# Patient Record
Sex: Male | Born: 1960
Health system: Southern US, Community
[De-identification: ages and names within clinical notes are randomized; demographics above are authoritative.]

## PROBLEM LIST (undated history)

## (undated) DIAGNOSIS — N529 Male erectile dysfunction, unspecified: Secondary | ICD-10-CM

## (undated) DIAGNOSIS — J302 Other seasonal allergic rhinitis: Secondary | ICD-10-CM

## (undated) DIAGNOSIS — C801 Malignant (primary) neoplasm, unspecified: Secondary | ICD-10-CM

## (undated) DIAGNOSIS — C4491 Basal cell carcinoma of skin, unspecified: Secondary | ICD-10-CM

## (undated) DIAGNOSIS — I480 Paroxysmal atrial fibrillation: Secondary | ICD-10-CM

## (undated) HISTORY — DX: Basal cell carcinoma of skin, unspecified: C44.91

## (undated) HISTORY — DX: Malignant (primary) neoplasm, unspecified: C80.1

## (undated) HISTORY — DX: Paroxysmal atrial fibrillation: I48.0

## (undated) HISTORY — PX: PROSTATECTOMY: SHX69

## (undated) HISTORY — DX: Male erectile dysfunction, unspecified: N52.9

## (undated) HISTORY — DX: Other seasonal allergic rhinitis: J30.2

---

## 1980-11-09 HISTORY — PX: LAMINECTOMY: SHX219

## 2004-10-22 ENCOUNTER — Inpatient Hospital Stay (HOSPITAL_COMMUNITY): Admission: RE | Admit: 2004-10-22 | Discharge: 2004-10-24 | Payer: Self-pay | Admitting: Orthopedic Surgery

## 2006-10-09 HISTORY — PX: JOINT REPLACEMENT: SHX530

## 2007-03-10 DIAGNOSIS — C801 Malignant (primary) neoplasm, unspecified: Secondary | ICD-10-CM

## 2007-03-10 HISTORY — DX: Malignant (primary) neoplasm, unspecified: C80.1

## 2007-04-07 ENCOUNTER — Encounter (INDEPENDENT_AMBULATORY_CARE_PROVIDER_SITE_OTHER): Payer: Self-pay | Admitting: Urology

## 2007-04-07 ENCOUNTER — Observation Stay (HOSPITAL_COMMUNITY): Admission: RE | Admit: 2007-04-07 | Discharge: 2007-04-08 | Payer: Self-pay | Admitting: Urology

## 2008-05-18 ENCOUNTER — Encounter: Admission: RE | Admit: 2008-05-18 | Discharge: 2008-05-18 | Payer: Self-pay | Admitting: Orthopedic Surgery

## 2011-03-24 NOTE — Discharge Summary (Signed)
NAME:  Ethan Powers, Ethan Powers NO.:  [id number]   MEDICAL RECORD NO.:  [id number]          PATIENT TYPE:  INP   LOCATION:  1432                         FACILITY:  [facility]   PHYSICIAN:  Heloise Purpura, MD      DATE OF BIRTH:  10-Dec-1960   DATE OF ADMISSION:  04/07/2007  DATE OF DISCHARGE:  04/08/2007                               DISCHARGE SUMMARY   ADMISSION DIAGNOSIS:  Prostate cancer.   DISCHARGE DIAGNOSIS:  Prostate cancer.   HISTORY AND PHYSICAL:  For full details, please see admission history  and physical.  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  After discussing management  options, he elected to proceed with surgical therapy.   HOSPITAL COURSE:  On Apr 07, 2007, the patient was taken to the  operating room and underwent a robotic- assisted laparoscopic radical  prostatectomy and bilateral pelvic lymphadenectomy.  He tolerated this  procedure well and without complications.  Postoperatively, he was able  to be transferred to a regular hospital room following recovery from  anesthesia.  He was able to begin ambulating which he did without  difficulty.  On the morning of postoperative day one, his hemoglobin was  checked and found to be stable at 13.1.  He had minimal drainage from  his pelvic drain and excellent urine output and his pelvic drain was  removed.  He was able to be on a clear liquid diet which he tolerated  without difficulty and was subsequently able to be transitioned to oral  pain medication.  By the afternoon of postoperative day one, he had met  all discharge criteria was able to be discharged home in excellent  condition.   DISPOSITION:  Home.   DISCHARGE MEDICATIONS:  The patient was instructed to take Vicodin as  needed for pain, Colace as a stool softener, and to begin taking Cipro  one day prior to his return visit for Foley catheter removal.   DISCHARGE INSTRUCTIONS:  The patient was  instructed to be ambulatory but  specifically told to refrain from any heavy lifting, strenuous activity,  or driving.  He was told to gradually advance his diet once passing  flatus.  In addition, he was instructed on routine Foley catheter care.   FOLLOWUP:  Dr. Providence Lanius will follow up in one week for removal of the  Foley catheter and to discuss his surgical pathology in detail.           ______________________________  Heloise Purpura, MD  Electronically Signed     LB/MEDQ  D:  04/08/2007  T:  04/08/2007  Job:  [id number]

## 2011-03-24 NOTE — H&P (Signed)
NAME:  Ethan Powers, Ethan Powers NO.:  [id number]   MEDICAL RECORD NO.:  [id number]          PATIENT TYPE:  INP   LOCATION:  X003                         FACILITY:  [facility]   PHYSICIAN:  Heloise Purpura, MD      DATE OF BIRTH:  12-22-1960   DATE OF ADMISSION:  04/07/2007  DATE OF DISCHARGE:                              HISTORY & PHYSICAL   CHIEF COMPLAINT:  Prostate cancer.   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  After discussing management options for clinically localized  prostate cancer, the patient elected to proceed with surgical therapy  and a robotic prostatectomy.   PAST MEDICAL HISTORY:  None.   PAST SURGICAL HISTORY:  1. Total knee arthroplasty in 2005.  2. Laminectomy in 1982.   MEDICATIONS:  None.   ALLERGIES:  NO KNOWN DRUG ALLERGIES.  The patient does have an  intolerance to BENADRYL which causes muscle spasms.   FAMILY HISTORY:  The patient's grandfather had prostate cancer, although  did not die of prostate cancer.  Both his parents lived to be age 40.   SOCIAL HISTORY:  The patient is a Education officer, community.  He is married.  He denies  tobacco use.  He does drink alcohol occasionally.   REVIEW OF SYSTEMS:  A complete review of systems was performed.  All  systems are negative.   PHYSICAL EXAMINATION:  CONSTITUTIONAL:  Well-nourished, well-developed  age-appropriate male in no acute distress.  CARDIOVASCULAR:  Regular rate and rhythm without obvious murmurs.  LUNGS:  Clear bilaterally.  ABDOMEN:  Soft, nontender, nondistended, without abdominal masses.  DIGITAL RECTAL:  No nodularity or induration.   IMPRESSION:  Clinically localized adenocarcinoma of the prostate.   PLAN:  Dr. Providence Lanius will undergo robotic assisted laparoscopic radical  prostatectomy and bilateral pelvic  lymphadenectomy.  He will then be  admitted to the hospital for routine postoperative care.           ______________________________  Heloise Purpura, MD     LB/MEDQ  D:  04/07/2007  T:  04/07/2007  Job:  [id number]

## 2011-03-24 NOTE — Op Note (Signed)
NAME:  Ethan Powers, Ethan Powers NO.:  [id number]   MEDICAL RECORD NO.:  [id number]          PATIENT TYPE:  INP   LOCATION:  X003                         [facility]AC[facility]   PHYSICIAN:  Heloise Purpura, MD      DATE OF BIRTH:  [birth year]   DATE OF PROCEDURE:  04/07/2007  DATE OF DISCHARGE:                               OPERATIVE REPORT   PREOPERATIVE DIAGNOSIS:  Clinically localized adenocarcinoma of the  prostate.   POSTOPERATIVE DIAGNOSIS:  Clinically localized adenocarcinoma of the  prostate.   PROCEDURES:  1. Robotic assisted laparoscopic radical prostatectomy (bilateral      nerve sparing).  2. Bilateral laparoscopic pelvic lymphadenectomy.   SURGEON:  Crecencio Mc, M.D.   ASSISTANT:  Dr. [place] Service.   ANESTHESIA:  General.   COMPLICATIONS:  None.   ESTIMATED BLOOD LOSS:  150 mL.   SPECIMENS:  1. Prostate seminal vesicles.  2. Right pelvic lymph nodes.  3. Left pelvic lymph nodes.   DISPOSITION OF SPECIMENS:  To pathology.   DRAINS:  1. 20-French straight catheter.  2. #19 Blake pelvic drain.   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  His pretreatment IPSS was 3 with an IIEF score of 25.  After discussing  management options for clinically localized prostate cancer, he elected  to proceed with surgical therapy and the above procedure.  Potential  risks and benefits were discussed with the patient and informed consent  was obtained.   DESCRIPTION OF PROCEDURE:  The patient was taken to the operating room  and a general anesthetic was administered.  He was given preoperative  antibiotics, placed in the dorsal lithotomy position, and prepped and  draped in the usual sterile fashion.  Next, a preoperative time-out was  performed.  A Foley catheter was then inserted into the bladder.  A site  was selected just inferior to the umbilicus for  placement of the camera  port.  This was placed using a standard open Hassan technique.  This  allowed entry into the peritoneal cavity under direct vision without  difficulty.  A 12 mm port was then placed and a pneumoperitoneum was  established.  The 0 degree lens was then used to inspect the abdomen.  There was no evidence of any intra-abdominal injuries.  There were noted  to be adhesions between the ascending colon and the abdominal wall in  the right lower quadrant.  An 8 mm robotic port was placed lateral and  just inferior to the camera port site.  An additional 8 mm robotic port  was placed in the far left lateral abdominal wall.  A 5 mm port was  placed just to the right and superior to the camera port.  Using these  ports which were placed under direct vision and without difficulty, the  aforementioned adhesions were taken down with laparoscopic scissors.  This allowed the two right lateral ports to be placed.  An 8 mm robotic  port was placed about  10 cm from the camera port laterally and just  inferior to the camera port.  An additional 12 mm port was placed in the  far right lateral abdominal wall.  All ports again were placed under  direct vision and without difficulty.  The surgical cart was then  docked.  With the aid of the cautery scissors, the bladder was reflected  posteriorly allowing entry into the space of Retzius and identification  of the endopelvic fascia and prostate.  The endopelvic fascia was  incised from the apex back to the base of the prostate bilaterally and  the underlying levator muscle fibers were swept laterally off the  prostate.  This isolated the dorsal venous complex which was then  stapled and divided with a 45 mm Flex ETS stapler.  The bladder neck was  identified with the aid of Foley catheter manipulation and was divided  anteriorly.  This exposed the Foley catheter.  The catheter balloon was  deflated and the catheter was brought into the  operative field and used  to retract the prostate anteriorly.  The posterior bladder neck was then  divided and dissection continued between the bladder and prostate until  the vasa deferentia and seminal vesicles were identified.  The vasa  deferentia were isolated and divided and lifted anteriorly.  The seminal  vesicles were then dissected down to their tips where care was taken to  control the seminal vesicle arterial blood supply.  The seminal vesicles  were then dissected free and lifted anteriorly.  The space between  Denonvilliers' fascia and the anterior rectum was then bluntly developed  thereby isolating the vascular pedicles of the prostate.  The lateral  prostatic fascia was then incised allowing the neurovascular bundles to  be swept laterally and posteriorly off the prostate.  The vascular  pedicles of the prostate were then ligated with Hem-o-lok clips above  the level of the neurovascular bundles and divided with sharp cold  scissor dissection.  The neurovascular bundles were then swept off the  apex of the prostate and urethra.  The urethra was sharply divided and  the prostate specimen was disarticulated.  The pelvis was copiously  irrigated and hemostasis was ensured.  There was noted to be a small  arterial bleeding site at the distal aspect of the left neurovascular  bundle.  This was carefully oversewn with a figure-of-eight Vicryl  suture with care taken not to disrupt the neurovascular bundle.  There  was no evidence of a rectal injury.  Attention then turned to the right  pelvic sidewall.  The fibrofatty tissue between the external iliac vein,  confluence of the iliac vessels, internal iliac artery, and Cooper's  ligament was dissected free from the pelvic sidewall with care to  preserve the obturator nerve.  Hem-o-loc clips were used for hemostasis  and lymphostasis.  This specimen was then passed off for permanent pathologic analysis.  An identical procedure was  then performed on the  contralateral side.  Attention then turned to the urethral anastomosis.  Denonvilliers' fascia was reapproximated to the posterior urethral  tissue and reapproximated with a 2-0 Vicryl slip-knot.  The bladder neck  and urethra was reapproximated with a 2-0 Vicryl slip-knot and a 3-0  Monocryl double-armed suture was used to perform a 360 degree running  tension-free anastomosis between the bladder neck and urethra.  A new 20-  [place] coude catheter was inserted into the bladder and irrigated.  There no blood clots within the bladder and the  anastomosis appeared to  be watertight.  A #19 Blake drain was brought through the left robotic  port and appropriately positioned in the pelvis.  It was secured to skin  with a nylon suture.  The surgical cart was then undocked.  The right  lateral 12 mm port site was closed with a 0 Vicryl suture placed with  the aid of the suture passer device.  The prostate specimen was removed  intact within the Endopouch retrieval bag via the periumbilical port  site.  This fascial opening was closed with a running 0 Vicryl suture.  All remaining ports were removed under  direct vision.  We injected 0.25% Marcaine into all port sites which  were reapproximated to the skin level with staples.  Sterile dressings  were applied.  The patient appeared to tolerate the procedure well and  without complications.  He was able to be extubated and transferred to  the recovery unit in satisfactory condition.           ______________________________  Heloise Purpura, MD  Electronically Signed     LB/MEDQ  D:  04/07/2007  T:  04/07/2007  Job:  [id number]

## 2011-03-27 NOTE — Op Note (Signed)
NAME:  Ethan, Powers NO.:  [id number]   MEDICAL RECORD NO.:  [id number]          PATIENT TYPE:  INP   LOCATION:  0003                         FACILITY:  [facility]   PHYSICIAN:  Ollen Gross, M.D.    DATE OF BIRTH:  1961/07/28   DATE OF PROCEDURE:  10/22/2004  DATE OF DISCHARGE:                                 OPERATIVE REPORT   PREOPERATIVE DIAGNOSIS:  Osteoarthritis, left knee.   POSTOPERATIVE DIAGNOSIS:  Osteoarthritis, left knee.   PROCEDURE:  Left total knee arthroplasty, computer navigated.   SURGEON:  Ollen Gross, M.D.   ASSISTANT:  Avel Peace, M.D.   ANESTHESIA:  Spinal.   ESTIMATED BLOOD LOSS:  Minimal.   DRAINS:  Hemovac x1.   TOURNIQUET TIME:  72 minutes at 300 mmHg.   COMPLICATIONS:  None.   CONDITION:  Stable.   BRIEF CLINICAL NOTE:  Ethan Powers is a 50 year old male who had a left knee  injury back in his high school and college days.  He had a multi-ligament  injury.  He has gone on to develop severe end-stage arthritis of the left  knee.  He has had intractable pain and presents now for left total knee  arthroplasty.   PROCEDURE IN DETAIL:  After successful administration of spinal anesthetic,  a tourniquet was placed on the left thigh and left lower extremity prepped  and draped in the usual sterile fashion.  Extremities were wrapped in  Esmarch, knee flexed and tourniquet inflated to 300 mmHg.  A midline  incision was made with a 10-blade through the subcutaneous tissue to the  level of the extensor mechanism.  A fresh blade was used to make a medial  parapatellar arthrotomy and then a soft tissue with proximal medial tibia  subperiosteally elevated to the joint line with a knife and semimembranosus  bursa with a Cobb elevator.  Soft tissue with a proximal lateral tibia was  also elevated with attention being paid to going to patellar tendon on  tibial tubercle.  The tail was everted and flexed 90 degrees and ACL and PCL  were  removed.  We then placed the 4 mm Schanz screws, two in the femur and  two in the tibia for the computer guidance.  We placed the computer arrays  and then began the registration process.  Once the computer registration was  completed, the tibial and femoral models are created and then the alignment  was found to be about 10-12 degrees of varus.  He had about a 3 degree  flexion contracture.  We then began the bone cuts.   The distal femoral cutting block was placed and with the computer guidance  we got it down to 0 degrees of varus and valgus along the access of the  femur and removed 10 mm off the distal femur.  The block is pinned and  resection made with an oscillating saw.  The reference guide was placed on  the tibial cut and confirmed that we made our planned cut.  We then placed  the rotational guide on the distal femur.  The computer had generated  a size  5.  We also put the intraoperative measuring device and it confirmed a size  5.  The rotation guide was placed and we had marked to rotate up the  epicondylar access.  The anterior/posterior block was then pinned for a size  5 and anterior and posterior chamfer cuts were made.  We confirmed that we  were level on the anterior cut and then proceeded with the tibia.   The tibia was subluxed forward and the menisci were removed.  The proximal  tibial cutting guide was placed and we referenced to take 10 mm of an  nondeficient lateral side which corresponded to 5.5 up to the deficient  medial side.  We put it in neutral varus/valgus.  The cut was then made with  an oscillating saw.  We confirmed that the cut made was the one planned.  A  size 5 was the most appropriate tibia component and the proximal tibia was  prepared at the modular drill and Kiel punch for a size 5.  The femoral  preparation was completed with the intercondylar cut for the size 5.   A size 5 posterior stabilized femoral size 5 mobile variant tibial trial and   then a 10 mm posterior stabilized rotating platform insert trial was placed.  With the 10 full extension achieved with just a tiny bit of varus and valgus  laxity.  We then went to a 12.5 but still had full extension with excellent  balance throughout full range of motion.  We confirmed that neutral  alignment was restored with the computer and we confirmed that we were  getting full extension.  The patella was then everted.  Thickness was  measured to be 27 mm, free hand resection taken to 15 mm, 41 template  placed.  Lug holes were drilled.  Trial patella was placed and it tracks  normally.  We then took the osteophytes off the posterior femur.  Upon  removing the osteophytes, I did visualize the posterior tine of one of these  staples.  This was on the medial side from the previous MCL reconstruction.  He had three staples total.  We identified the three staples.  I removed one  very easily.  A second one was not going to impinge on any of the bone cuts.  It would not be in the joint thus I left that in place.  We then found a  posterior one which was the one that was visible.  I removed that.  I put  the trial back in and went through a full range of motion without anything  impinging upon it.  We again inspected posteriorly and all the osteophytes  were removed and there was no other visible hardware.   We then removed all the trials and the cup bone surfaces are subsequently  prepared with pulsatile lavage.  Cement was mixed and once implantation the  size #5 mobile bearing tibial tray size 5 posterior stabilized femur and 41  patellar cement into place and patella patella to clamp.  Trial 12-5 inserts  placed and held in full extension.  All extruding cement was removed.  Once  the cement was fully hardened then a permanent 12.5 mm posterior stabilized  routine flap were placed into the tibial tray.  Wound was copiously irrigated with saline solution and the extensor mechanism was  closed over a  Hemovac drain with interrupted #1 PDS.  Prior to this, the Schanz pins for  the computer were  removed.  Tourniquets for the patella time was 72 minutes.  Flexion and gravity was 140 degree.  Subcu was closed with interrupted 2-0  Vicryl and subcuticular running 4-0 Monocryl.  The incision was clean and  dried, drain hooked to suction and Steri-Strips and a bulky sterile dressing  was applied.  He was then placed to a knee immobilizer, awakened and  transported to recovery in stable condition.     Drenda Freeze   FA/MEDQ  D:  10/22/2004  T:  10/22/2004  Job:  [id number]

## 2011-03-27 NOTE — H&P (Signed)
NAME:  Ethan Powers, Ethan Powers               ACCOUNT NO.:  [id number]   MEDICAL RECORD NO.:  [id number]          PATIENT TYPE:  INP   LOCATION:  NA                           FACILITY:  [facility]   PHYSICIAN:  Ollen Gross, M.D.    DATE OF BIRTH:  August 16, 1961   DATE OF ADMISSION:  DATE OF DISCHARGE:                                HISTORY & PHYSICAL   DATE OF OFFICE VISIT HISTORY AND PHYSICAL:  September 25, 2004.   CHIEF COMPLAINT:  Left knee pain.   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  He had ligament reconstruction at that time  and he has subsequent arthroscopy with cartilage removal. The pain has  progressively gotten worse over the past several years.  He has been a  Network engineer of [employer] and has had several cortisone injections in the past.  He has also had a series of Synvisc injections which did not help.  He has  pain all the time now as well as at night.  He does the majority of his  dental work while he is sitting down.  It is starting to interfere with his  career and his lifestyle.  He is a very athletic individual and would like  to maintain his activity. It is felt he would benefit from undergoing knee  replacement.  The risks and benefits of the surgery have been discussed with  the patient and it is felt he would be an appropriate candidate and he is  subsequently admitted to the hospital.   ALLERGIES:  BENADRYL causes muscle spasms.   CURRENT MEDICATIONS:  Naprosyn.   PAST MEDICAL HISTORY:  Negative.   PAST SURGICAL HISTORY:  Ligament reconstruction left knee, left knee  arthroscopy, L2 laminectomy in 1991.   FAMILY HISTORY:  Mother living age 64 in good health. Father living age 53  with non-insulin-dependent diabetes.   SOCIAL HISTORY:  He is  married.  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   REVIEW OF SYMPTOMS:  GENERAL:  No fever, chills or night sweats. NEUROLOGIC:  No seizure, syncope, paralysis. RESPIRATORY:  No shortness of breath,  productive cough or hemoptysis. CARDIOVASCULAR:  No chest pain, angina,  orthopnea. GI:  No nausea, vomiting, diarrhea or constipation.  GU:  No  dysuria, hematuria or discharge. MUSCULOSKELETAL:  Left knee found in the  history of present illness.   PHYSICAL EXAMINATION:  VITAL SIGNS:  Pulse 58, respirations 14, blood  pressure 132/82.  GENERAL:  A 50 year old, tall, thin framed, athletic build, white male,  alert, oriented and cooperative, very pleasant at the time of exam, appears  to be an excellent historian.  HEENT:  Normocephalic, atraumatic.  Pupils round and reactive.  EOM's are  intact.  NECK:  Supple, no  carotid bruits.  CHEST:  Clear anterior posterior chest walls, no rhonchi, rales or wheezing.  HEART:  Regular rate and rhythm without murmurs, S1 and S2 noted.  ABDOMEN:  Soft, flat, nontender, bowel sounds present.  RECTAL/BREAST/GENITALIA:  Not done not pertinent to present illness.  EXTREMITIES:  Left knee, he does have a medial parapatellar incision which  is well healed, there is no effusion.  Range of motion of 5-115 degrees.  He  is very tender medially.  Motor function is intact.   IMPRESSION:  Osteoarthritis left knee.   PLAN:  The patient admitted to [facility] to undergo a left total  knee arthroplasty.  The surgery will be performed by Dr. Ollen Gross.     Alex   ALP/MEDQ  D:  10/21/2004  T:  10/21/2004  Job:  [id number]

## 2011-12-16 ENCOUNTER — Institutional Professional Consult (permissible substitution): Payer: Self-pay | Admitting: Internal Medicine

## 2012-01-07 ENCOUNTER — Encounter (HOSPITAL_COMMUNITY): Payer: Self-pay | Admitting: Pharmacy Technician

## 2012-01-11 ENCOUNTER — Other Ambulatory Visit: Payer: Self-pay | Admitting: Cardiology

## 2012-01-12 ENCOUNTER — Encounter: Payer: Self-pay | Admitting: Cardiology

## 2012-01-12 ENCOUNTER — Other Ambulatory Visit: Payer: Self-pay | Admitting: Cardiology

## 2012-01-12 NOTE — H&P (Addendum)
Office Visit     Patient: Kamarian, Sahakian Provider: Michaell Cowing. Emelda Fear, NP  DOB: October 18, 1961   Age: 51 Y   Sex: Male Date: 01/08/2012  Phone: [phone]  Address: [address], [place], [id number]  Pcp: Blair Heys    --------------------------------------------------------------------------------  Subjective:    CC:      1. pre cardioversion work up/tt/labs/see [employer].      HPI:     General:           Mr Petter is a 51 yo male followed by Dr Mayford Knife with a hx of PAF. He presented for ETT and was found to be back in atrial fibrillation and was started on metoprolol. He then underwent nuclear stress test showing no ischemia and normal LVF. 2D echocardiogram showed normal LVF. He was placed on Xarelto on 10/22/11 and later started on Flecainide. He had consultation with EP at [facility] and they agree he should proceed with antiarrhythmic therapy, anticoagulation and cardioversion prior to considering ablation. He feels shortness of breath with exercise when in the Atrial fibrillation and mild lightheadedness. no chest pain nor syncope..         Patient denies chest pain, syncope, swelling, nor PND.     ROS:      as noted in HPI, all other systems negative except occasinal headaches, he has stopped all caffeine and alcohol intake.     Medical History: Prostate cancer (s/p prostatectomy 5/08), Seasonal allergy, Palpitations, ED, Basal Cell Skin Cancer (2010-clavicular area), Paroxysmal atrial fibrillation.      Surgical History: Radical Retropubic Prostatectomy (Lap; nerve sparing) 03/2007, L2-3 lumbar laminectomy 1982, Left knee replacement 10/2006.      Hospitalization/Major Diagnostic Procedure: above surgery .      Family History:  Father: alive A + W Mother: alive A + W Paternal Grand Father: Prostate cancer Paternal uncle: colon cancer      Social History:      General: History of smoking  cigarettes:  Never smoked. no Smoking. Alcohol: yes, occasionally on weekends up to  2-3 beer and occasionally a martini. Caffeine: yes. no Recreational drug use. Occupation: Education officer, community.      Medications: Lysine 500 MG Tablet 1 tablet Once a day, Centrum Silver Tablet 1 tablet once a day, Metoprolol Tartrate 25 MG Tablet 1 tablet Once a day, Flecainide Acetate 50 MG Tablet 1 tablet every 12 hrs, Xarelto 20 mg 20 tablet 1 tablet once a day, Medication List reviewed and reconciled with the patient     Allergies: N.K.D.A.     Objective:    Vitals: Wt 178.8, Wt change 7.8 lb, Ht 72, BMI 24.25, Pulse sitting 102 apical/irregular, BP sitting 110/82.     Examination:     Cardiology, General:         GENERAL APPEARANCE: pleasant, NAD.  HEENT: unremarkable.  CAROTID UPSTROKE: normal, no bruit.  JVD: flat.  HEART SOUNDS: irregular, irregular-- normal S1, S2, no S3 or S4.  MURMUR: absent.  LUNGS: no rales or wheezes.  ABDOMEN: soft, non tender, positive bowel sounds, .  EXTREMITIES: no leg edema.  PERIPHERAL PULSES: 2 plus bilateral.            Assessment:    Assessment:  1. Atrial fibrillation - 427.31 (Primary)     Plan:    1. Atrial fibrillation  Change in instructions Metoprolol Tartrate Tablet, 25 MG, 1/2 tablet, Orally, twice a day, 30 days, 30, Refills 5 ;  Continue Flecainide Acetate Tablet, 50 MG, 1 tablet, Orally, every  12 hrs ;  Continue Xarelto 20 mg tablet, 20, 1 tablet, orally, once a day .         LAB: CBC with Diff     WBC 5.1 4.0-11.0 - K/ul         RBC 4.17 4.20-5.80 - M/uL L       HGB 14.1 13.0-17.0 - g/dL        HCT 04.5 40.9-81.1 - %         MCH 33.7 27.0-33.0 - pg H       MPV 7.8 7.5-10.7 - fL         MCV 98.8 80.0-94.0 - fL H       MCHC 34.2 32.0-36.0 - g/dL        RDW 91.4 78.2-95.6 - %        PLT 342 150-400 - K/uL        NEUT % 54.8 43.3-71.9 - %        LYMPH% 26.5 16.8-43.5 - %         MONO % 16.3 4.6-12.4 - % H       EOS % 1.4 0.0-7.8 - %        BASO % 1.0 0.0-1.0 - %        NEUT # 2.7 1.9-7.2 - K/uL        LYMPH# 1.40 1.10-2.70 - K/uL         MONO # 0.8 0.3-0.8 - K/uL        EOS # 0.1 0.0-0.6 - K/uL        BASO # 0.1 0.0-0.1 - K/uL               FERGUSON,CYNTHIA A 01/08/2012 12:16:53 PM > for cardioversion McVey,Linda 01/08/2012 12:23:24 PM >        LAB: Basic Metabolic      GLUCOSE 66 70-99 - mg/dL L       BUN 14 2-13 - mg/dL        CREATININE 0.86 0.60-1.30 - mg/dl        eGFR (NON-AFRICAN AMERICAN) 89 >60 - calc        eGFR (AFRICAN AMERICAN) 107 >60 - calc        SODIUM 138 136-145 - mmol/L        POTASSIUM 4.6 3.5-5.5 - mmol/L        CHLORIDE 103 98-107 - mmol/L        C02 31 22-32 - mg/dL        ANION GAP 8.4 5.7-84.6 - mmol/L        CALCIUM 9.7 8.6-10.3 - mg/dL               FERGUSON,CYNTHIA A 01/08/2012 12:17:15 PM > ok for cardioversion McVey,Linda 01/08/2012 12:23:32 PM >        LAB: PT and PTT ([id number])     aPTT 29 24-33 - SEC        INR 1.2 0.8-1.2 -         Prothrombin Time 12.6 9.1-12.0 - SEC H              FERGUSON,CYNTHIA A 01/10/2012 01:14:13 PM > pre cardioversion, pt on Xarelto McVey,Linda 01/11/2012 08:07:09 AM >   Diagnostic Imaging:EKG Atrial fibrillation, Boehler,Eileen 01/08/2012 08:41:06 AM >     Reviewed Cardioversion procedure including potential risk including skin irritation, strokes, arrthymias, and reaction to sedatives,.          Immunizations:  Labs:      Procedure Codes: 09811 ECL CBC PLATELET DIFF, 80048 ECL BMP, 93000 EKG I AND R, [id number] BLOOD COLLECTION ROUTINE VENIPUNCTURE     Preventive:           Follow Up: TT pending cardioversion (Reason: Atrial fibrillation)        Provider: Michaell Cowing. Emelda Fear, NP  Patient: York, Valliant  DOB: 09-Jul-1961  Date: 01/08/2012

## 2012-01-15 ENCOUNTER — Other Ambulatory Visit: Payer: Self-pay

## 2012-01-15 ENCOUNTER — Encounter (HOSPITAL_COMMUNITY)
Admission: RE | Disposition: A | Discharge status: Home / Self Care | Payer: Self-pay | Source: Ambulatory Visit | Attending: Cardiology

## 2012-01-15 ENCOUNTER — Encounter (HOSPITAL_COMMUNITY): Payer: Self-pay | Admitting: Certified Registered"

## 2012-01-15 ENCOUNTER — Ambulatory Visit (HOSPITAL_COMMUNITY)
Admission: RE | Admit: 2012-01-15 | Discharge: 2012-01-15 | Disposition: A | Discharge status: Home / Self Care | Payer: 59 | Source: Ambulatory Visit | Attending: Cardiology | Admitting: Cardiology

## 2012-01-15 ENCOUNTER — Encounter (HOSPITAL_COMMUNITY): Payer: Self-pay | Admitting: Cardiology

## 2012-01-15 ENCOUNTER — Ambulatory Visit (HOSPITAL_COMMUNITY): Payer: 59 | Admitting: Certified Registered"

## 2012-01-15 DIAGNOSIS — Z8546 Personal history of malignant neoplasm of prostate: Secondary | ICD-10-CM | POA: Insufficient documentation

## 2012-01-15 DIAGNOSIS — I4891 Unspecified atrial fibrillation: Secondary | ICD-10-CM

## 2012-01-15 DIAGNOSIS — Z96659 Presence of unspecified artificial knee joint: Secondary | ICD-10-CM | POA: Insufficient documentation

## 2012-01-15 HISTORY — PX: CARDIOVERSION: SHX1299

## 2012-01-15 LAB — APTT: aPTT: 32 seconds (ref 24–37)

## 2012-01-15 SURGERY — CARDIOVERSION
Anesthesia: General | Wound class: Clean

## 2012-01-15 MED ORDER — HYDROMORPHONE HCL PF 1 MG/ML IJ SOLN: 0.2500 mg | INTRAMUSCULAR | Status: DC | PRN

## 2012-01-15 MED ORDER — ONDANSETRON HCL 4 MG/2ML IJ SOLN: 4.0000 mg | Freq: Once | INTRAMUSCULAR | Status: DC | PRN

## 2012-01-15 MED ORDER — LIDOCAINE HCL (CARDIAC) 20 MG/ML IV SOLN
INTRAVENOUS | Status: DC | PRN
  Administered 2012-01-15: 20 mg via INTRAVENOUS

## 2012-01-15 MED ORDER — SODIUM CHLORIDE 0.9 % IV SOLN
250.0000 mL | INTRAVENOUS | Status: DC
  Administered 2012-01-15: 11:00:00 via INTRAVENOUS

## 2012-01-15 MED ORDER — HYDROCORTISONE 1 % EX CREA
1.0000 "application " | TOPICAL_CREAM | Freq: Three times a day (TID) | CUTANEOUS | Status: DC | PRN
  Filled 2012-01-15: qty 28

## 2012-01-15 MED ORDER — SODIUM CHLORIDE 0.9 % IJ SOLN: 3.0000 mL | Freq: Two times a day (BID) | INTRAMUSCULAR | Status: DC

## 2012-01-15 MED ORDER — PROPOFOL 10 MG/ML IV BOLUS
INTRAVENOUS | Status: DC | PRN
  Administered 2012-01-15: 90 ug via INTRAVENOUS

## 2012-01-15 MED ORDER — FLECAINIDE ACETATE 100 MG PO TABS: ORAL_TABLET | ORAL | Status: DC

## 2012-01-15 MED ORDER — HYDROCORTISONE 1 % EX CREA
TOPICAL_CREAM | CUTANEOUS | Status: AC
  Filled 2012-01-15: qty 28

## 2012-01-15 MED ORDER — SODIUM CHLORIDE 0.9 % IJ SOLN: 3.0000 mL | INTRAMUSCULAR | Status: DC | PRN

## 2012-01-15 NOTE — CV Procedure (Signed)
Electrical Cardioversion Procedure Note Ethan Powers [id number] 07/01/1961 Procedure: Electrical Cardioversion Indications:  Atrial Fibrillation  Time Out: Verified patient identification, verified procedure,medications/allergies/relevent history reviewed, required imaging and test results available.  Performed  Procedure Details  The patient was NPO after midnight. Anesthesia was administered at the beside  by Dr.Smith with 90mg  of propofol.  Cardioversion was done with synchronized biphasic defibrillation with AP pads with 150watts.  The patient converted to normal sinus rhythm for a beat and converted back to atrial fibrillation.  He was then cardioverted with 200watts which was unsuccessful in converting to NSR. The patient tolerated the procedure well   IMPRESSION:  Unsuccessful cardioversion of atrial fibrillation.  PLAN:  Increase Flecainide to 100mg  BID Followup in my office in 1 week    Ethan Powers R 01/15/2012, 11:58 AM

## 2012-01-15 NOTE — Discharge Instructions (Signed)
Atrial Fibrillation Your caregiver has diagnosed you with atrial fibrillation (AFib). The heart normally beats very regularly; AFib is a type of irregular heartbeat. The heart rate may be faster or slower than normal. This can prevent your heart from pumping as well as it should. AFib can be constant (chronic) or intermittent (paroxysmal). CAUSES  Atrial fibrillation may be caused by:  Heart disease, including heart attack, coronary artery disease, heart failure, diseases of the heart valves, and others.   Blood clot in the lungs (pulmonary embolism).   Pneumonia or other infections.   Chronic lung disease.   Thyroid disease.   Toxins. These include alcohol, some medications (such as decongestant medications or diet pills), and caffeine.  In some people, no cause for AFib can be found. This is referred to as Lone Atrial Fibrillation. SYMPTOMS   Palpitations or a fluttering in your chest.   A vague sense of chest discomfort.   Shortness of breath.   Sudden onset of lightheadedness or weakness.  Sometimes, the first sign of AFib can be a complication of the condition. This could be a stroke or heart failure. DIAGNOSIS  Your description of your condition may make your caregiver suspicious of atrial fibrillation. Your caregiver will examine your pulse to determine if fibrillation is present. An EKG (electrocardiogram) will confirm the diagnosis. Further testing may help determine what caused you to have atrial fibrillation. This may include chest x-ray, echocardiogram, blood tests, or CT scans. PREVENTION  If you have previously had atrial fibrillation, your caregiver may advise you to avoid substances known to cause the condition (such as stimulant medications, and possibly caffeine or alcohol). You may be advised to use medications to prevent recurrence. Proper treatment of any underlying condition is important to help prevent recurrence. PROGNOSIS  Atrial fibrillation does tend to  become a chronic condition over time. It can cause significant complications (see below). Atrial fibrillation is not usually immediately life-threatening, but it can shorten your life expectancy. This seems to be worse in women. If you have lone atrial fibrillation and are under 60 years old, the risk of complications is very low, and life expectancy is not shortened. RISKS AND COMPLICATIONS  Complications of atrial fibrillation can include stroke, chest pain, and heart failure. Your caregiver will recommend treatments for the atrial fibrillation, as well as for any underlying conditions, to help minimize risk of complications. TREATMENT  Treatment for AFib is divided into several categories:  Treatment of any underlying condition.   Converting you out of AFib into a regular (sinus) rhythm.   Controlling rapid heart rate.   Prevention of blood clots and stroke.  Medications and procedures are available to convert your atrial fibrillation to sinus rhythm. However, recent studies have shown that this may not offer you any advantage, and cardiac experts are continuing research and debate on this topic. More important is controlling your rapid heartbeat. The rapid heartbeat causes more symptoms, and places strain on your heart. Your caregiver will advise you on the use of medications that can control your heart rate. Atrial fibrillation is a strong stroke risk. You can lessen this risk by taking blood thinning medications such as Coumadin (warfarin), or sometimes aspirin. These medications need close monitoring by your caregiver. Over-medication can cause bleeding. Too little medication may not protect against stroke. HOME CARE INSTRUCTIONS   If your caregiver prescribed medicine to make your heartbeat more normally, take as directed.   If blood thinners were prescribed by your caregiver, take EXACTLY as directed.     Perform blood tests EXACTLY as directed.   Quit smoking. Smoking increases your  cardiac and lung (pulmonary) risks.   DO NOT drink alcohol.   DO NOT drink caffeinated drinks (e.g. coffee, soda, chocolate, and leaf teas). You may drink decaffeinated coffee, soda or tea.   If you are overweight, you should choose a reduced calorie diet to lose weight. Please see a registered dietitian if you need more information about healthy weight loss. DO NOT USE DIET PILLS as they may aggravate heart problems.   If you have other heart problems that are causing AFib, you may need to eat a low salt, fat, and cholesterol diet. Your caregiver will tell you if this is necessary.   Exercise every day to improve your physical fitness. Stay active unless advised otherwise.   If your caregiver has given you a follow-up appointment, it is very important to keep that appointment. Not keeping the appointment could result in heart failure or stroke. If there is any problem keeping the appointment, you must call back to this facility for assistance.  SEEK MEDICAL CARE IF:  You notice a change in the rate, rhythm or strength of your heartbeat.   You develop an infection or any other change in your overall health status.  SEEK IMMEDIATE MEDICAL CARE IF:   You develop chest pain, abdominal pain, sweating, weakness or feel sick to your stomach (nausea).   You develop shortness of breath.   You develop swollen feet and ankles.   You develop dizziness, numbness, or weakness of your face or limbs, or any change in vision or speech.  MAKE SURE YOU:   Understand these instructions.   Will watch your condition.   Will get help right away if you are not doing well or get worse.  Document Released: 10/26/2005 Document Revised: 10/15/2011 Document Reviewed: 05/30/2008 [facility] Patient Information 2012 [place], [place].Electrical Cardioversion Cardioversion is the delivery of a jolt of electricity to change the rhythm of the heart. Sticky patches or metal paddles are placed on the chest to deliver  the electricity from a special device. This is done to restore a normal rhythm. A rhythm that is too fast or not regular keeps the heart from pumping well. Compared to medicines used to change an abnormal rhythm, cardioversion is faster and works better. It is also unpleasant and may dislodge blood clots from the heart. WHEN WOULD THIS BE DONE?  In an emergency:   There is low or no blood pressure as a result of the heart rhythm.   Normal rhythm must be restored as fast as possible to protect the brain and heart from further damage.   It may save a life.   For less serious heart rhythms, such as atrial fibrillation or flutter, in which:   The heart is beating too fast or is not regular.   The heart is still able to pump enough blood, but not as well as it should.   Medicine to change the rhythm has not worked.   It is safe to wait in order to allow time for preparation.  LET YOUR CAREGIVER KNOW ABOUT:   Every medicine you are taking. It is very important to do this! Know when to take or stop taking any of them.   Any time in the past that you have felt your heart was not beating normally.  RISKS AND COMPLICATIONS   Clots may form in the chambers of the heart if it is beating too fast. These clots may  be dislodged during the procedure and travel to other parts of the body.   There is risk of a stroke during and after the procedure if a clot moves. Blood thinners lower this risk.   You may have a special test of your heart (TEE) to make sure there are no clots in your heart.  BEFORE THE PROCEDURE   You may have some tests to see how well your heart is working.   You may start taking blood thinners so your blood does not clot as easily.   Other drugs may be given to help your heart work better.  PROCEDURE (SCHEDULED)  The procedure is typically done in a hospital by a heart doctor (cardiologist).   You will be told when and where to go.   You may be given some medicine  through an intravenous (IV) access to reduce discomfort and make you sleepy before the procedure.   Your whole body may move when the shock is delivered. Your chest may feel sore.   You may be able to go home after a few hours. Your heart rhythm will be watched to make sure it does not change.  HOME CARE INSTRUCTIONS   Only take medicine as directed by your caregiver. Be sure you understand how and when to take your medicine.   Learn how to feel your pulse and check it often.   Limit your activity for 48 hours.   Avoid caffeine and other stimulants as directed.  SEEK MEDICAL CARE IF:   You feel like your heart is beating too fast or your pulse is not regular.   You have any questions about your medicines.   You have bleeding that will not stop.  SEEK IMMEDIATE MEDICAL CARE IF:   You are dizzy or feel faint.   It is hard to breathe or you feel short of breath.   There is a change in discomfort in your chest.   Your speech is slurred or you have trouble moving your arm or leg on one side.   You get a muscle cramp.   Your fingers or toes turn cold or blue.  MAKE SURE YOU:   Understand these instructions.   Will watch your condition.   Will get help right away if you are not doing well or get worse.  Document Released: 10/16/2002 Document Revised: 10/15/2011 Document Reviewed: 02/15/2008 [facility] Patient Information 2012 [place], [place].

## 2012-01-15 NOTE — Interval H&P Note (Signed)
History and Physical Interval Note:  01/15/2012 11:53 AM  Ethan Powers  has presented today for surgery, with the diagnosis of AFIB  The various methods of treatment have been discussed with the patient and family. After consideration of risks, benefits and other options for treatment, the patient has consented to  Procedure(s) (LRB): CARDIOVERSION (N/A) as a surgical intervention .  The patients' history has been reviewed, patient examined, no change in status, stable for surgery.  I have reviewed the patients' chart and labs.  Questions were answered to the patient's satisfaction.     Jadalynn Burr R

## 2012-01-15 NOTE — Transfer of Care (Signed)
Immediate Anesthesia Transfer of Care Note  Patient: Ethan Powers  Procedure(s) Performed: Procedure(s) (LRB): CARDIOVERSION (N/A)  Patient Location: PACU and Short Stay  Anesthesia Type: General  Level of Consciousness: awake and alert   Airway & Oxygen Therapy: Patient connected to nasal cannula oxygen  Post-op Assessment: Report given to PACU RN and Post -op Vital signs reviewed and stable  Post vital signs: stable  Complications: No apparent anesthesia complications

## 2012-01-15 NOTE — Anesthesia Preprocedure Evaluation (Addendum)
Anesthesia Evaluation  Patient identified by MRN, date of birth, ID band Patient awake    Reviewed: Allergy & Precautions, H&P , NPO status   Airway       Dental   Pulmonary          Cardiovascular + dysrhythmias Atrial Fibrillation Rhythm:irregular Rate:Normal     Neuro/Psych    GI/Hepatic   Endo/Other    Renal/GU      Musculoskeletal   Abdominal   Peds  Hematology   Anesthesia Other Findings   Reproductive/Obstetrics                           Anesthesia Physical Anesthesia Plan  ASA: II  Anesthesia Plan: General   Post-op Pain Management:    Induction: Intravenous  Airway Management Planned: Mask  Additional Equipment:   Intra-op Plan:   Post-operative Plan:   Informed Consent: I have reviewed the patients History and Physical, chart, labs and discussed the procedure including the risks, benefits and alternatives for the proposed anesthesia with the patient or authorized representative who has indicated his/her understanding and acceptance.   Dental Advisory Given  Plan Discussed with: CRNA, Anesthesiologist and Surgeon  Anesthesia Plan Comments:        Anesthesia Quick Evaluation

## 2012-01-15 NOTE — H&P (View-Only) (Signed)
Office Visit     Patient: Polkowski, Naszir Provider: Cynthia A. Ferguson, NP  DOB: 10/26/1961   Age: 51 Y   Sex: Male Date: 01/08/2012  Phone: [phone]  Address: [address], [place], [place]  Pcp: ROBERT EHINGER    --------------------------------------------------------------------------------  Subjective:    CC:      1. pre cardioversion work up/tt/labs/see Linda.      HPI:     General:           Mr Biegler is a 51 yo male followed by Dr Atara Paterson with a hx of PAF. He presented for ETT and was found to be back in atrial fibrillation and was started on metoprolol. He then underwent nuclear stress test showing no ischemia and normal LVF. 2D echocardiogram showed normal LVF. He was placed on Xarelto on 10/22/11 and later started on Flecainide. He had consultation with EP at [facility] and they agree he should proceed with antiarrhythmic therapy, anticoagulation and cardioversion prior to considering ablation. He feels shortness of breath with exercise when in the Atrial fibrillation and mild lightheadedness. no chest pain nor syncope..         Patient denies chest pain, syncope, swelling, nor PND.     ROS:      as noted in HPI, all other systems negative except occasinal headaches, he has stopped all caffeine and alcohol intake.     Medical History: Prostate cancer (s/p prostatectomy 5/08), Seasonal allergy, Palpitations, ED, Basal Cell Skin Cancer (2010-clavicular area), Paroxysmal atrial fibrillation.      Surgical History: Radical Retropubic Prostatectomy (Lap; nerve sparing) 03/2007, L2-3 lumbar laminectomy 1982, Left knee replacement 10/2006.      Hospitalization/Major Diagnostic Procedure: above surgery .      Family History:  Father: alive A + W Mother: alive A + W Paternal Grand Father: Prostate cancer Paternal uncle: colon cancer      Social History:      General: History of smoking  cigarettes:  Never smoked. no Smoking. Alcohol: yes, occasionally on weekends up to  2-3 beer and occasionally a martini. Caffeine: yes. no Recreational drug use. Occupation: Dentist.      Medications: Lysine 500 MG Tablet 1 tablet Once a day, Centrum Silver Tablet 1 tablet once a day, Metoprolol Tartrate 25 MG Tablet 1 tablet Once a day, Flecainide Acetate 50 MG Tablet 1 tablet every 12 hrs, Xarelto 20 mg 20 tablet 1 tablet once a day, Medication List reviewed and reconciled with the patient     Allergies: N.K.D.A.     Objective:    Vitals: Wt 178.8, Wt change 7.8 lb, Ht 72, BMI 24.25, Pulse sitting 102 apical/irregular, BP sitting 110/82.     Examination:     Cardiology, General:         GENERAL APPEARANCE: pleasant, NAD.  HEENT: unremarkable.  CAROTID UPSTROKE: normal, no bruit.  JVD: flat.  HEART SOUNDS: irregular, irregular-- normal S1, S2, no S3 or S4.  MURMUR: absent.  LUNGS: no rales or wheezes.  ABDOMEN: soft, non tender, positive bowel sounds, .  EXTREMITIES: no leg edema.  PERIPHERAL PULSES: 2 plus bilateral.            Assessment:    Assessment:  1. Atrial fibrillation - 427.31 (Primary)     Plan:    1. Atrial fibrillation  Change in instructions Metoprolol Tartrate Tablet, 25 MG, 1/2 tablet, Orally, twice a day, 30 days, 30, Refills 5 ;  Continue Flecainide Acetate Tablet, 50 MG, 1 tablet, Orally, every   12 hrs ;  Continue Xarelto 20 mg tablet, 20, 1 tablet, orally, once a day .         LAB: CBC with Diff     WBC 5.1 4.0-11.0 - K/ul         RBC 4.17 4.20-5.80 - M/uL L       HGB 14.1 13.0-17.0 - g/dL        HCT 41.2 39.0-52.0 - %         MCH 33.7 27.0-33.0 - pg H       MPV 7.8 7.5-10.7 - fL         MCV 98.8 80.0-94.0 - fL H       MCHC 34.2 32.0-36.0 - g/dL        RDW 13.1 11.5-15.5 - %        PLT 342 150-400 - K/uL        NEUT % 54.8 43.3-71.9 - %        LYMPH% 26.5 16.8-43.5 - %         MONO % 16.3 4.6-12.4 - % H       EOS % 1.4 0.0-7.8 - %        BASO % 1.0 0.0-1.0 - %        NEUT # 2.7 1.9-7.2 - K/uL        LYMPH# 1.40 1.10-2.70 - K/uL         MONO # 0.8 0.3-0.8 - K/uL        EOS # 0.1 0.0-0.6 - K/uL        BASO # 0.1 0.0-0.1 - K/uL               FERGUSON,CYNTHIA A 01/08/2012 12:16:53 PM > for cardioversion McVey,Linda 01/08/2012 12:23:24 PM >        LAB: Basic Metabolic      GLUCOSE 66 70-99 - mg/dL L       BUN 14 6-26 - mg/dL        CREATININE 0.90 0.60-1.30 - mg/dl        eGFR (NON-AFRICAN AMERICAN) 89 >60 - calc        eGFR (AFRICAN AMERICAN) 107 >60 - calc        SODIUM 138 136-145 - mmol/L        POTASSIUM 4.6 3.5-5.5 - mmol/L        CHLORIDE 103 98-107 - mmol/L        C02 31 22-32 - mg/dL        ANION GAP 8.4 6.0-20.0 - mmol/L        CALCIUM 9.7 8.6-10.3 - mg/dL               FERGUSON,CYNTHIA A 01/08/2012 12:17:15 PM > ok for cardioversion McVey,Linda 01/08/2012 12:23:32 PM >        LAB: PT and PTT (020321)     aPTT 29 24-33 - SEC        INR 1.2 0.8-1.2 -         Prothrombin Time 12.6 9.1-12.0 - SEC H              FERGUSON,CYNTHIA A 01/10/2012 01:14:13 PM > pre cardioversion, pt on Xarelto McVey,Linda 01/11/2012 08:07:09 AM >   Diagnostic Imaging:EKG Atrial fibrillation, Boehler,Eileen 01/08/2012 08:41:06 AM >     Reviewed Cardioversion procedure including potential risk including skin irritation, strokes, arrthymias, and reaction to sedatives,.          Immunizations:         Labs:      Procedure Codes: 85025 ECL CBC PLATELET DIFF, 80048 ECL BMP, 93000 EKG I AND R, 36415 BLOOD COLLECTION ROUTINE VENIPUNCTURE     Preventive:           Follow Up: TT pending cardioversion (Reason: Atrial fibrillation)        Provider: Cynthia A. Ferguson, NP  Patient: Douglass, Vashon  DOB: 08/10/1961  Date: 01/08/2012   

## 2012-01-15 NOTE — Anesthesia Postprocedure Evaluation (Signed)
  Anesthesia Post-op Note  Patient: Ethan Powers  Procedure(s) Performed: Procedure(s) (LRB): CARDIOVERSION (N/A)  Patient Location: PACU and Short Stay  Anesthesia Type: General  Level of Consciousness: awake and alert   Airway and Oxygen Therapy: Patient Spontanous Breathing  Post-op Pain: none  Post-op Assessment: Post-op Vital signs reviewed  Post-op Vital Signs: stable  Complications: No apparent anesthesia complications

## 2012-01-18 ENCOUNTER — Encounter (HOSPITAL_COMMUNITY): Payer: Self-pay | Admitting: Cardiology

## 2012-03-15 ENCOUNTER — Other Ambulatory Visit: Payer: Self-pay | Admitting: Family Medicine

## 2012-03-15 DIAGNOSIS — K7689 Other specified diseases of liver: Secondary | ICD-10-CM

## 2012-03-18 ENCOUNTER — Ambulatory Visit
Admission: RE | Admit: 2012-03-18 | Discharge: 2012-03-18 | Disposition: A | Discharge status: Home / Self Care | Payer: 59 | Source: Ambulatory Visit | Attending: Family Medicine | Admitting: Family Medicine

## 2012-03-18 DIAGNOSIS — K7689 Other specified diseases of liver: Secondary | ICD-10-CM

## 2012-11-09 HISTORY — PX: ATRIAL FIBRILLATION ABLATION: EP1191

## 2013-07-31 ENCOUNTER — Other Ambulatory Visit: Payer: Self-pay | Admitting: Otolaryngology

## 2013-07-31 DIAGNOSIS — R221 Localized swelling, mass and lump, neck: Secondary | ICD-10-CM

## 2013-08-02 ENCOUNTER — Ambulatory Visit
Admission: RE | Admit: 2013-08-02 | Discharge: 2013-08-02 | Disposition: A | Discharge status: Home / Self Care | Payer: 59 | Source: Ambulatory Visit | Attending: Otolaryngology | Admitting: Otolaryngology

## 2013-08-02 DIAGNOSIS — R221 Localized swelling, mass and lump, neck: Secondary | ICD-10-CM

## 2013-08-02 MED ORDER — IOHEXOL 300 MG/ML  SOLN
75.0000 mL | Freq: Once | INTRAMUSCULAR | Status: AC | PRN
  Administered 2013-08-02: 75 mL via INTRAVENOUS

## 2014-07-23 ENCOUNTER — Ambulatory Visit (INDEPENDENT_AMBULATORY_CARE_PROVIDER_SITE_OTHER): Payer: 59 | Admitting: Sports Medicine

## 2014-07-23 ENCOUNTER — Encounter: Payer: Self-pay | Admitting: Sports Medicine

## 2014-07-23 VITALS — BP 124/81 | HR 69 | Ht 72.0 in | Wt 168.0 lb

## 2014-07-23 DIAGNOSIS — M25519 Pain in unspecified shoulder: Secondary | ICD-10-CM

## 2014-07-23 DIAGNOSIS — M25512 Pain in left shoulder: Secondary | ICD-10-CM
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
## 2014-07-30 ENCOUNTER — Ambulatory Visit
Admission: RE | Admit: 2014-07-30 | Discharge: 2014-07-30 | Disposition: A | Discharge status: Home / Self Care | Payer: 59 | Source: Ambulatory Visit | Attending: Sports Medicine | Admitting: Sports Medicine

## 2014-07-30 ENCOUNTER — Telehealth: Payer: Self-pay | Admitting: *Deleted

## 2014-07-30 DIAGNOSIS — M25512 Pain in left shoulder: Secondary | ICD-10-CM

## 2014-07-30 NOTE — Telephone Encounter (Signed)
cld pt to let him know the shoulder mri was pre authorized for today. He also might be able to show up for his xrays of the shoulder and lumbar spine during lunch today.

## 2014-07-31 ENCOUNTER — Telehealth: Payer: Self-pay | Admitting: Sports Medicine

## 2014-07-31 NOTE — Telephone Encounter (Signed)
I spoke with the patient on the day after reviewing the MRI of his left shoulder. MRI confirms what the ultrasound showed. He has a large a full-thickness partial width tear of the majority of the supraspinatus tendon with 1.3 cm of retraction. Patient will be referred to Dr. Justice Britain to discuss rotator cuff repair. I also evaluated x-rays of his lumbar spine which show mild degenerative changes but nothing marked. I explained to the patient that we need to have his rotator cuff tear addressed first and then I would be happy to see him back to discuss his ongoing back issues. I'll defer further workup and treatment of his rotator cuff tear to Dr. Justice Britain.

## 2014-08-08 ENCOUNTER — Ambulatory Visit (INDEPENDENT_AMBULATORY_CARE_PROVIDER_SITE_OTHER): Payer: 59 | Admitting: Sports Medicine

## 2014-08-08 ENCOUNTER — Encounter: Payer: Self-pay | Admitting: Sports Medicine

## 2014-08-08 VITALS — BP 129/80 | Ht 72.0 in | Wt 168.0 lb

## 2014-08-08 DIAGNOSIS — M545 Low back pain, unspecified: Secondary | ICD-10-CM

## 2014-08-08 DIAGNOSIS — M79605 Pain in left leg: Secondary | ICD-10-CM

## 2014-08-08 NOTE — Assessment & Plan Note (Signed)
If his tightness and radiating symptoms persist would not try a low dose of gabapentin or amitriptyline just at nighttime  Prior to doing this we will start a series of knee to chest and other flexion exercises as well as pelvic tilts and Superman exercises.  If he gets improvement with his back exercises routine then we will not use any particular medications.  He will recheck with [place] after he completes recovered from his shoulder surgery

## 2014-08-08 NOTE — Progress Notes (Signed)
Patient ID: Ethan Powers, male   DOB: 05/21/1961, 53 y.o.   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 This was found on ultrasound and confirmed on MRI. He was scheduled to have surgical repair with Dr. supple in 2 days.  Chronic left sided low back pain. This starts in his low back and radiates into his upper buttocks. 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 He wound up having a laminectomy of L2 about 30 years ago. Since that time he gets intermittent low back pain but usually is more in the upper buttocks were deep in the buttocks. Years ago he had some true sciatica but he rarely has any true sciatic symptoms although some symptoms may radiate into the upper posterior thigh. Certain things will bother this including sitting or driving.  Lumbosacral spine films were reviewed and he has some mild lumbar scoliosis. He actually has pretty well preserved disc spaces.  X-ray changes of the lumbar spine overall are only mildly abnormal.  Physical examination No acute distress BP 129/80  Ht 6' (1.829 m)  Wt 168 lb (76.204 kg)  BMI 22.78 kg/m2  Flexion of the low back causes tightness in his left buttocks at about 50 but he has full motion Back extension does not seem to exacerbate symptoms Right lateral bending causes left buttocks tightness Rotation does not cause symptoms  Straight leg raise causes tightness on the left at about 45-50  FABER is pretty tight bilaterally with limited rotation. SI joint seems to move Heel toe and tandem walk all are normal in strength seems to be preserved.

## 2015-01-23 ENCOUNTER — Other Ambulatory Visit: Payer: Self-pay | Admitting: Dermatology

## 2016-12-28 DIAGNOSIS — D170 Benign lipomatous neoplasm of skin and subcutaneous tissue of head, face and neck: Secondary | ICD-10-CM | POA: Diagnosis not present

## 2016-12-28 DIAGNOSIS — Z85828 Personal history of other malignant neoplasm of skin: Secondary | ICD-10-CM | POA: Diagnosis not present

## 2016-12-28 DIAGNOSIS — D235 Other benign neoplasm of skin of trunk: Secondary | ICD-10-CM | POA: Diagnosis not present

## 2016-12-28 DIAGNOSIS — L570 Actinic keratosis: Secondary | ICD-10-CM | POA: Diagnosis not present

## 2017-05-24 DIAGNOSIS — C44722 Squamous cell carcinoma of skin of right lower limb, including hip: Secondary | ICD-10-CM | POA: Diagnosis not present

## 2017-05-24 DIAGNOSIS — C44519 Basal cell carcinoma of skin of other part of trunk: Secondary | ICD-10-CM | POA: Diagnosis not present

## 2017-07-23 DIAGNOSIS — Z13220 Encounter for screening for lipoid disorders: Secondary | ICD-10-CM | POA: Diagnosis not present

## 2017-07-23 DIAGNOSIS — Z0000 Encounter for general adult medical examination without abnormal findings: Secondary | ICD-10-CM | POA: Diagnosis not present

## 2018-01-03 DIAGNOSIS — D485 Neoplasm of uncertain behavior of skin: Secondary | ICD-10-CM | POA: Diagnosis not present

## 2018-01-03 DIAGNOSIS — Z85828 Personal history of other malignant neoplasm of skin: Secondary | ICD-10-CM | POA: Diagnosis not present

## 2018-01-03 DIAGNOSIS — D1801 Hemangioma of skin and subcutaneous tissue: Secondary | ICD-10-CM | POA: Diagnosis not present

## 2018-01-03 DIAGNOSIS — L821 Other seborrheic keratosis: Secondary | ICD-10-CM | POA: Diagnosis not present

## 2018-01-03 DIAGNOSIS — L570 Actinic keratosis: Secondary | ICD-10-CM | POA: Diagnosis not present

## 2018-05-04 DIAGNOSIS — I4891 Unspecified atrial fibrillation: Secondary | ICD-10-CM | POA: Diagnosis not present

## 2018-05-04 DIAGNOSIS — I491 Atrial premature depolarization: Secondary | ICD-10-CM | POA: Diagnosis not present

## 2018-05-09 ENCOUNTER — Ambulatory Visit (HOSPITAL_COMMUNITY): Payer: Self-pay | Admitting: Nurse Practitioner

## 2018-05-13 ENCOUNTER — Ambulatory Visit (HOSPITAL_COMMUNITY): Payer: Self-pay | Admitting: Nurse Practitioner

## 2018-07-01 ENCOUNTER — Encounter (HOSPITAL_COMMUNITY): Payer: Self-pay | Admitting: Nurse Practitioner

## 2018-07-01 ENCOUNTER — Ambulatory Visit (HOSPITAL_COMMUNITY)
Admission: RE | Admit: 2018-07-01 | Discharge: 2018-07-01 | Disposition: A | Discharge status: Home / Self Care | Payer: 59 | Source: Ambulatory Visit | Attending: Nurse Practitioner | Admitting: Nurse Practitioner

## 2018-07-01 VITALS — BP 118/68 | HR 84 | Ht 72.0 in | Wt 172.0 lb

## 2018-07-01 DIAGNOSIS — Z8546 Personal history of malignant neoplasm of prostate: Secondary | ICD-10-CM | POA: Insufficient documentation

## 2018-07-01 DIAGNOSIS — I4891 Unspecified atrial fibrillation: Secondary | ICD-10-CM | POA: Insufficient documentation

## 2018-07-01 DIAGNOSIS — Z9079 Acquired absence of other genital organ(s): Secondary | ICD-10-CM | POA: Diagnosis not present

## 2018-07-01 DIAGNOSIS — Z79899 Other long term (current) drug therapy: Secondary | ICD-10-CM | POA: Insufficient documentation

## 2018-07-01 DIAGNOSIS — I480 Paroxysmal atrial fibrillation: Secondary | ICD-10-CM | POA: Diagnosis not present

## 2018-07-01 DIAGNOSIS — Z7982 Long term (current) use of aspirin: Secondary | ICD-10-CM | POA: Insufficient documentation

## 2018-07-01 DIAGNOSIS — Z96652 Presence of left artificial knee joint: Secondary | ICD-10-CM | POA: Diagnosis not present

## 2018-07-01 DIAGNOSIS — Z85828 Personal history of other malignant neoplasm of skin: Secondary | ICD-10-CM | POA: Diagnosis not present

## 2018-07-01 DIAGNOSIS — Z7901 Long term (current) use of anticoagulants: Secondary | ICD-10-CM | POA: Diagnosis not present

## 2018-07-01 NOTE — Addendum Note (Signed)
Encounter addended by: Mardelle Matte, CMA on: 07/01/2018 12:57 PM  Actions taken: Order list changed, Order Reconciliation Section accessed, Medication List reviewed

## 2018-07-01 NOTE — Progress Notes (Signed)
Primary Care Physician: Ethan Arabian, MD Referring Physician: Dr. Briscoe Deutscher Powers is a 57 y.o. male with a h/o afib ablation, prior flecainide/BB use, in 2014 at [facility]. He has been off all drugs since that time. He was vacationing in [place] a few weeks ago and felt irregular heart beat that felt like his previous afib. He has noticed off and on symptoms since that time. His Cardia shows several strips that show possible afib but the few I reviewed showed some artifact, SR with PAC's. He feels the symptoms most days. He recently saw his PCP who recommended starting back on xarelto and having f/u here. He is interested in repeat ablation, would like to have done in this area if needed. He does not want to go back to daily drugs.  Today, he denies symptoms of palpitations, chest pain, shortness of breath, orthopnea, PND, lower extremity edema, dizziness, presyncope, syncope, or neurologic sequela. The patient is tolerating medications without difficulties and is otherwise without complaint today.   Past Medical History:  Diagnosis Date  . Basal cell cancer   . Cancer [facility]) 03/2007   prostate Cancer s/p prostatectomy  . Dysrhythmia    atrial fibrillation  . Erectile dysfunction   . Seasonal allergies    Past Surgical History:  Procedure Laterality Date  . CARDIOVERSION  01/15/2012   Procedure: CARDIOVERSION;  Surgeon: Ethan Margarita, MD;  Location: [facility] OR;  Service: Cardiovascular;  Laterality: N/A;  . JOINT REPLACEMENT  10/2006   left knee replacement  . LAMINECTOMY  1982   L2/L3  . PROSTATECTOMY      Current Outpatient Medications  Medication Sig Dispense Refill  . Multiple Vitamin (MULITIVITAMIN WITH MINERALS) TABS Take 1 tablet by mouth daily.    . Rivaroxaban 20 MG TABS Take 20 mg by mouth daily.    Marland Kitchen aspirin EC 81 MG tablet Take by mouth.    . flecainide (TAMBOCOR) 100 MG tablet Take 1 tablet by mouth twice daily (Patient not taking: Reported on  07/01/2018) 60 tablet 11  . L-LYSINE PO Take 1 tablet by mouth daily.    . methylPREDNISolone (MEDROL) 4 MG tablet     . metoprolol tartrate (LOPRESSOR) 25 MG tablet Take 25 mg by mouth daily.     No current facility-administered medications for this encounter.     No Known Allergies  Social History   Socioeconomic History  . Marital status: Married    Spouse name: Not on file  . Number of children: Not on file  . Years of education: Not on file  . Highest education level: Not on file  Occupational History  . Not on file  Social Needs  . Financial resource strain: Not on file  . Food insecurity:    Worry: Not on file    Inability: Not on file  . Transportation needs:    Medical: Not on file    Non-medical: Not on file  Tobacco Use  . Smoking status: Never Smoker  . Smokeless tobacco: Never Used  Substance and Sexual Activity  . Alcohol use: Yes    Alcohol/week: 2.0 - 3.0 standard drinks    Types: 2 - 3 Cans of beer per week  . Drug use: Not on file  . Sexual activity: Not on file  Lifestyle  . Physical activity:    Days per week: Not on file    Minutes per session: Not on file  . Stress: Not on file  Relationships  .  Social connections:    Talks on phone: Not on file    Gets together: Not on file    Attends religious service: Not on file    Active member of club or organization: Not on file    Attends meetings of clubs or organizations: Not on file    Relationship status: Not on file  . Intimate partner violence:    Fear of current or ex partner: Not on file    Emotionally abused: Not on file    Physically abused: Not on file    Forced sexual activity: Not on file  Other Topics Concern  . Not on file  Social History Narrative  . Not on file    Family History  Problem Relation Age of Onset  . Cancer Paternal Grandfather        prostate CA    ROS- All systems are reviewed and negative except as per the HPI above  Physical Exam: Vitals:   07/01/18  0850  BP: 118/68  Pulse: 84  Weight: 78 kg  Height: 6' (1.829 m)   Wt Readings from Last 3 Encounters:  07/01/18 78 kg  08/08/14 76.2 kg  07/23/14 76.2 kg    Labs: No results found for: NA, K, CL, CO2, GLUCOSE, BUN, CREATININE, CALCIUM, PHOS, MG No results found for: INR No results found for: CHOL, HDL, LDLCALC, TRIG   GEN- The patient is well appearing, alert and oriented x 3 today.   Head- normocephalic, atraumatic Eyes-  Sclera clear, conjunctiva pink Ears- hearing intact Oropharynx- clear Neck- supple, no JVP Lymph- no cervical lymphadenopathy Lungs- Clear to ausculation bilaterally, normal work of breathing Heart- Regular rate and rhythm, no murmurs, rubs or gallops, PMI not laterally displaced GI- soft, NT, ND, + BS Extremities- no clubbing, cyanosis, or edema MS- no significant deformity or atrophy Skin- no rash or lesion Psych- euthymic mood, full affect Neuro- strength and sensation are intact  EKG-NSR, with PAC's  at 84 bpm, pr int 134 ms, qrs int 92 ms, qtc 420 ms Epic records reviewed    Assessment and Plan: 1. Afib Pt feels that he is back to having paroxysmal afib  He would like to have repeat ablation However, I would like to see if we can capture afib as his Cardia shows a lot of SR with PAC's  Will place a ZIO patch for one week monitor as he feels something daily He will continue xarelto 20 mg daily for a chadsvasc score of 0 for now  Requested an appointment with Dr. Rayann Heman for 3-4 weeks  Ethan Powers, [facility] [address] [place], [place] [address] [phone]

## 2018-07-13 DIAGNOSIS — I480 Paroxysmal atrial fibrillation: Secondary | ICD-10-CM | POA: Diagnosis not present

## 2018-07-20 ENCOUNTER — Institutional Professional Consult (permissible substitution): Payer: 59 | Admitting: Internal Medicine

## 2018-07-21 ENCOUNTER — Encounter: Payer: Self-pay | Admitting: Internal Medicine

## 2018-08-08 ENCOUNTER — Encounter: Payer: Self-pay | Admitting: Internal Medicine

## 2018-08-08 ENCOUNTER — Ambulatory Visit (INDEPENDENT_AMBULATORY_CARE_PROVIDER_SITE_OTHER): Payer: 59 | Admitting: Internal Medicine

## 2018-08-08 VITALS — BP 126/74 | HR 77 | Ht 72.0 in | Wt 175.2 lb

## 2018-08-08 DIAGNOSIS — R0602 Shortness of breath: Secondary | ICD-10-CM | POA: Diagnosis not present

## 2018-08-08 DIAGNOSIS — I480 Paroxysmal atrial fibrillation: Secondary | ICD-10-CM

## 2018-08-08 MED ORDER — DILTIAZEM HCL ER COATED BEADS 120 MG PO CP24: 120.0000 mg | ORAL_CAPSULE | Freq: Every day | ORAL | 3 refills | Status: DC

## 2018-08-08 NOTE — Patient Instructions (Addendum)
Medication Instructions:  Your physician has recommended you make the following change in your medication:   1.  Stop taking XARELTO  2.  Start taking Diltiazem CD 120 mg daily  Labwork: You will get lab work today:  BMP, CBC, TSH and free T4  Testing/Procedures: Your physician has requested that you have an echocardiogram. Echocardiography is a painless test that uses sound waves to create images of your heart. It provides your doctor with information about the size and shape of your heart and how well your heart's chambers and valves are working. This procedure takes approximately one hour. There are no restrictions for this procedure.  Please schedule for ECHO  Your physician has requested that you have en exercise stress myoview. For further information please visit [email or web address]. Please follow instruction sheet, as given.  Please schedule for exercise myoview  Follow-Up: Your physician wants you to follow-up in:  6 weeks with Dr. Rayann Heman.    Any Other Special Instructions Will Be Listed Below (If Applicable).  If you need a refill on your cardiac medications before your next appointment, please call your pharmacy.

## 2018-08-08 NOTE — Progress Notes (Signed)
Electrophysiology Office Note   Date:  08/08/2018   ID:  Ethan Powers, DOB 02/07/1961, MRN [id number]  PCP:  Ethan Arabian, MD    Primary Electrophysiologist: Ethan Grayer, MD    CC: afib   History of Present Illness: Ethan Powers is a 57 y.o. male who presents today for electrophysiology evaluation.   He is referred by Ethan Palau NP for EP consultation regarding afib.  He reports having atrial fibrillation for more than 5 years.  He failed medical therapy with flecainide and underwent AF ablation at [facility] 2014.  He has done well since that time.  While recently [place] in [place], he had palpitations.  He used Chad.  Strips reveal PACs.  He continued to have episodes and now presents for further evaluation.  He wore a ZIO patch which documented NSVT as well as disorganized atrial activity with PACs and episodes of up to 29 seconds.  He finds that he has SOB with exercise.  He also has brief episodes of dizziness.  Today, he denies symptoms of chest pain, orthopnea, PND, lower extremity edema, claudication,  presyncope, syncope, bleeding, or neurologic sequela. The patient is tolerating medications without difficulties and is otherwise without complaint today.    Past Medical History:  Diagnosis Date  . Basal cell cancer   . Cancer [facility]) 03/2007   prostate Cancer s/p prostatectomy  . Erectile dysfunction   . Paroxysmal atrial fibrillation (HCC)    atrial fibrillation  . Seasonal allergies    Past Surgical History:  Procedure Laterality Date  . CARDIOVERSION  01/15/2012   Procedure: CARDIOVERSION;  Surgeon: Sueanne Margarita, MD;  Location: [facility] OR;  Service: Cardiovascular;  Laterality: N/A;  . JOINT REPLACEMENT  10/2006   left knee replacement  . LAMINECTOMY  1982   L2/L3  . PROSTATECTOMY       No current outpatient medications on file.   No current facility-administered medications for this visit.     Allergies:   Patient has no known allergies.   Social  History:  The patient  reports that he has never smoked. He has never used smokeless tobacco. He reports that he drinks about 2.0 - 3.0 standard drinks of alcohol per week.   Family History:  The patient's family history includes Cancer in his paternal grandfather.    ROS:  Please see the history of present illness.   All other systems are personally reviewed and negative.    PHYSICAL EXAM: VS:  BP 126/74   Pulse 77   Ht 6' (1.829 m)   Wt 175 lb 3.2 oz (79.5 kg)   SpO2 98%   BMI 23.76 kg/m  , BMI Body mass index is 23.76 kg/m. GEN: Well nourished, well developed, in no acute distress  HEENT: normal  Neck: no JVD, carotid bruits, or masses Cardiac: RRR; no murmurs, rubs, or gallops,no edema  Respiratory:  clear to auscultation bilaterally, normal work of breathing GI: soft, nontender, nondistended, + BS MS: no deformity or atrophy  Skin: warm and dry  Neuro:  Strength and sensation are intact Psych: euthymic mood, full affect  EKG:  EKG is ordered today. The ekg ordered today is personally reviewed and shows sinus rhythm with PACs  Recent Zio patch reveals short NSVT as well as nonsustained atrial arrhythmias.   Lipid Panel  No results found for: CHOL, TRIG, HDL, CHOLHDL, VLDL, LDLCALC, LDLDIRECT personally reviewed   Wt Readings from Last 3 Encounters:  08/08/18 175 lb 3.2 oz (79.5 kg)  07/01/18  172 lb (78 kg)  08/08/14 168 lb (76.2 kg)      Other studies personally reviewed: Additional studies/ records that were reviewed today include: AF clinic notes  Review of the above records today demonstrates: as above   ASSESSMENT AND PLAN:  1.  Paroxysmal atrial fibrillation He has done very well post ablation.  He now has disorganized atrial activity, though not longer than 30 seconds.  chads2vasc score is 0.  Stop anticoagulation as per guidelines at this time. He has been working on lifestyle modification aggressively. Echo to evaluate for structural heart  changes Add diltiazem 120mg  daily  2. SOB with activity Echo Exercise myoview to evaluate for ischemia/ exercise induced arrhythmias   Follow-up:  Return in 6 weeks  Current medicines are reviewed at length with the patient today.   The patient does not have concerns regarding his medicines.  The following changes were made today:  none  Labs/ tests ordered today include:  Orders Placed This Encounter  Procedures  . EKG 12-Lead     Signed, Ethan Grayer, MD  08/08/2018 4:08 PM     [facility] [address] [place] [address] [phone] (office) [phone] (fax)

## 2018-08-09 LAB — CBC WITH DIFFERENTIAL/PLATELET
Basophils Absolute: 0 10*3/uL (ref 0.0–0.2)
Basos: 1 %
EOS (ABSOLUTE): 0.1 10*3/uL (ref 0.0–0.4)
Eos: 1 %
Hematocrit: 37 % — ABNORMAL LOW (ref 37.5–51.0)
Hemoglobin: 13 g/dL (ref 13.0–17.7)
IMMATURE GRANS (ABS): 0 10*3/uL (ref 0.0–0.1)
Immature Granulocytes: 0 %
Lymphocytes Absolute: 1.6 10*3/uL (ref 0.7–3.1)
Lymphs: 25 %
MCH: 32.7 pg (ref 26.6–33.0)
MCHC: 35.1 g/dL (ref 31.5–35.7)
MCV: 93 fL (ref 79–97)
Monocytes Absolute: 1 10*3/uL — ABNORMAL HIGH (ref 0.1–0.9)
Monocytes: 16 %
NEUTROS ABS: 3.6 10*3/uL (ref 1.4–7.0)
Neutrophils: 57 %
PLATELETS: 412 10*3/uL (ref 150–450)
RBC: 3.98 x10E6/uL — ABNORMAL LOW (ref 4.14–5.80)
RDW: 13.5 % (ref 12.3–15.4)
WBC: 6.4 10*3/uL (ref 3.4–10.8)

## 2018-08-09 LAB — BASIC METABOLIC PANEL
BUN / CREAT RATIO: 17 (ref 9–20)
BUN: 14 mg/dL (ref 6–24)
CO2: 24 mmol/L (ref 20–29)
Calcium: 9.4 mg/dL (ref 8.7–10.2)
Chloride: 101 mmol/L (ref 96–106)
Creatinine, Ser: 0.81 mg/dL (ref 0.76–1.27)
GFR calc Af Amer: 113 mL/min/{1.73_m2} (ref >59)
GFR calc non Af Amer: 98 mL/min/{1.73_m2} (ref >59)
Glucose: 84 mg/dL (ref 65–99)
Potassium: 4.3 mmol/L (ref 3.5–5.2)
Sodium: 137 mmol/L (ref 134–144)

## 2018-08-09 LAB — TSH: TSH: 3.55 u[IU]/mL (ref 0.450–4.500)

## 2018-08-09 LAB — T4, FREE: Free T4: 1.15 ng/dL (ref 0.82–1.77)

## 2018-08-17 ENCOUNTER — Telehealth: Payer: Self-pay

## 2018-08-17 NOTE — Telephone Encounter (Signed)
Notes recorded by Frederik Schmidt, RN on 08/17/2018 at 10:13 AM EDT [id number] 10/9 ------

## 2018-08-17 NOTE — Telephone Encounter (Signed)
-----   Message from Thompson Grayer, MD sent at 08/14/2018  9:32 PM EDT ----- Results reviewed.  Sonia Baller, please inform pt of result. I will route to primary care also.  Mild anemia noted.  Would follow-up with PCP.

## 2018-08-18 NOTE — Telephone Encounter (Signed)
  Returning call regarding lab results 

## 2018-08-18 NOTE — Telephone Encounter (Signed)
Informed patient of lab results and let him know that the lab results have been forwarded to PCP.  He verbalized understanding.

## 2018-08-19 ENCOUNTER — Other Ambulatory Visit: Payer: Self-pay

## 2018-08-19 ENCOUNTER — Ambulatory Visit (HOSPITAL_BASED_OUTPATIENT_CLINIC_OR_DEPARTMENT_OTHER): Payer: 59

## 2018-08-19 ENCOUNTER — Ambulatory Visit (HOSPITAL_COMMUNITY): Payer: 59 | Attending: Cardiology

## 2018-08-19 DIAGNOSIS — I480 Paroxysmal atrial fibrillation: Secondary | ICD-10-CM | POA: Insufficient documentation

## 2018-08-19 DIAGNOSIS — R0602 Shortness of breath: Secondary | ICD-10-CM

## 2018-08-19 LAB — MYOCARDIAL PERFUSION IMAGING
CHL CUP NUCLEAR SRS: 0
CHL CUP NUCLEAR SSS: 0
CSEPEW: 13.4 METS
CSEPPHR: 164 {beats}/min
Exercise duration (min): 11 min
Exercise duration (sec): 0 s
LV dias vol: 143 mL (ref 62–150)
LVSYSVOL: 63 mL
MPHR: 162 {beats}/min
Percent HR: 101 %
Rest HR: 62 {beats}/min
SDS: 0
TID: 0.95

## 2018-08-19 LAB — ECHOCARDIOGRAM COMPLETE
HEIGHTINCHES: 72 in
WEIGHTICAEL: 2800 [oz_av]

## 2018-08-19 MED ORDER — TECHNETIUM TC 99M TETROFOSMIN IV KIT
9.8000 | PACK | Freq: Once | INTRAVENOUS | Status: AC | PRN
  Administered 2018-08-19: 9.8 via INTRAVENOUS
  Filled 2018-08-19: qty 10

## 2018-08-19 MED ORDER — TECHNETIUM TC 99M TETROFOSMIN IV KIT
31.4000 | PACK | Freq: Once | INTRAVENOUS | Status: AC | PRN
  Administered 2018-08-19: 31.4 via INTRAVENOUS
  Filled 2018-08-19: qty 32

## 2018-08-29 DIAGNOSIS — L570 Actinic keratosis: Secondary | ICD-10-CM | POA: Diagnosis not present

## 2018-08-29 DIAGNOSIS — D0422 Carcinoma in situ of skin of left ear and external auricular canal: Secondary | ICD-10-CM | POA: Diagnosis not present

## 2018-09-02 ENCOUNTER — Encounter: Payer: Self-pay | Admitting: *Deleted

## 2018-09-26 ENCOUNTER — Encounter: Payer: Self-pay | Admitting: Internal Medicine

## 2018-09-26 ENCOUNTER — Ambulatory Visit: Payer: 59 | Admitting: Internal Medicine

## 2018-09-26 VITALS — BP 126/72 | HR 85 | Ht 72.0 in | Wt 174.2 lb

## 2018-09-26 DIAGNOSIS — I480 Paroxysmal atrial fibrillation: Secondary | ICD-10-CM

## 2018-09-26 MED ORDER — FLECAINIDE ACETATE 50 MG PO TABS: 50.0000 mg | ORAL_TABLET | Freq: Two times a day (BID) | ORAL | 3 refills | Status: DC

## 2018-09-26 MED ORDER — DILTIAZEM HCL ER COATED BEADS 240 MG PO CP24: 240.0000 mg | ORAL_CAPSULE | Freq: Every day | ORAL | 3 refills | Status: DC

## 2018-09-26 NOTE — Patient Instructions (Addendum)
Medication Instructions:  Your physician has recommended you make the following change in your medication:   1.  TODAY increase your diltiazem ER --- Take 240 mg by mouth daily.  2.  In 2 WEEKS October 10, 2018--Start taking flecainide 50 mg  ---One tablet by mouth twice a day.  Labwork: None ordered.  Testing/Procedures: None ordered.  Follow-Up: Your physician wants you to follow-up in: 4 weeks with Dr. Rayann Heman.      Any Other Special Instructions Will Be Listed Below (If Applicable).  If you need a refill on your cardiac medications before your next appointment, please call your pharmacy.    Flecainide tablets What is this medicine? FLECAINIDE (FLEK a nide) is an antiarrhythmic drug. This medicine is used to prevent irregular heart rhythm. It can also slow down fast heartbeats called tachycardia. This medicine may be used for other purposes; ask your health care provider or pharmacist if you have questions. COMMON BRAND NAME(S): Tambocor What should I tell my health care provider before I take this medicine? They need to know if you have any of these conditions: -abnormal levels of potassium in the blood -heart disease including heart rhythm and heart rate problems -kidney or liver disease -recent heart attack -an unusual or allergic reaction to flecainide, local anesthetics, other medicines, foods, dyes, or preservatives -pregnant or trying to get pregnant -breast-feeding How should I use this medicine? Take this medicine by mouth with a glass of water. Follow the directions on the prescription label. You can take this medicine with or without food. Take your doses at regular intervals. Do not take your medicine more often than directed. Do not stop taking this medicine suddenly. This may cause serious, heart-related side effects. If your doctor wants you to stop the medicine, the dose may be slowly lowered over time to avoid any side effects. Talk to your pediatrician  regarding the use of this medicine in children. While this drug may be prescribed for children as young as 1 year of age for selected conditions, precautions do apply. Overdosage: If you think you have taken too much of this medicine contact a poison control center or emergency room at once. NOTE: This medicine is only for you. Do not share this medicine with others. What if I miss a dose? If you miss a dose, take it as soon as you can. If it is almost time for your next dose, take only that dose. Do not take double or extra doses. What may interact with this medicine? Do not take this medicine with any of the following medications: -amoxapine -arsenic trioxide -certain antibiotics like clarithromycin, erythromycin, gatifloxacin, gemifloxacin, levofloxacin, moxifloxacin, sparfloxacin, or troleandomycin -certain antidepressants called tricyclic antidepressants like amitriptyline, imipramine, or nortriptyline -certain medicines to control heart rhythm like disopyramide, dofetilide, encainide, moricizine, procainamide, propafenone, and quinidine -cisapride -cyclobenzaprine -delavirdine -droperidol -haloperidol -hawthorn -imatinib -levomethadyl -maprotiline -medicines for malaria like chloroquine and halofantrine -pentamidine -phenothiazines like chlorpromazine, mesoridazine, prochlorperazine, thioridazine -pimozide -quinine -ranolazine -ritonavir -sertindole -ziprasidone This medicine may also interact with the following medications: -cimetidine -medicines for angina or high blood pressure -medicines to control heart rhythm like amiodarone and digoxin This list may not describe all possible interactions. Give your health care provider a list of all the medicines, herbs, non-prescription drugs, or dietary supplements you use. Also tell them if you smoke, drink alcohol, or use illegal drugs. Some items may interact with your medicine. What should I watch for while using this  medicine? Visit your doctor or health care professional  for regular checks on your progress. Because your condition and the use of this medicine carries some risk, it is a good idea to carry an identification card, necklace or bracelet with details of your condition, medications and doctor or health care professional. Check your blood pressure and pulse rate regularly. Ask your health care professional what your blood pressure and pulse rate should be, and when you should contact him or her. Your doctor or health care professional also may schedule regular blood tests and electrocardiograms to check your progress. You may get drowsy or dizzy. Do not drive, use machinery, or do anything that needs mental alertness until you know how this medicine affects you. Do not stand or sit up quickly, especially if you are an older patient. This reduces the risk of dizzy or fainting spells. Alcohol can make you more dizzy, increase flushing and rapid heartbeats. Avoid alcoholic drinks. What side effects may I notice from receiving this medicine? Side effects that you should report to your doctor or health care professional as soon as possible: -chest pain, continued irregular heartbeats -difficulty breathing -swelling of the legs or feet -trembling, shaking -unusually weak or tired Side effects that usually do not require medical attention (report to your doctor or health care professional if they continue or are bothersome): -blurred vision -constipation -headache -nausea, vomiting -stomach pain This list may not describe all possible side effects. Call your doctor for medical advice about side effects. You may report side effects to FDA at 1-800-FDA-1088. Where should I keep my medicine? Keep out of the reach of children. Store at room temperature between 15 and 30 degrees C (59 and 86 degrees F). Protect from light. Keep container tightly closed. Throw away any unused medicine after the expiration  date. NOTE: This sheet is a summary. It may not cover all possible information. If you have questions about this medicine, talk to your doctor, pharmacist, or health care provider.  2018 Elsevier/Gold Standard (2008-02-29 16:46:09)

## 2018-09-26 NOTE — Progress Notes (Signed)
   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  Since last being seen in our clinic, the patient reports doing very well.  He continues to have frequent palpitations. Today, he denies symptoms of chest pain, shortness of breath,  lower extremity edema, dizziness, presyncope, or syncope.  The patient is otherwise without complaint today.   Past Medical History:  Diagnosis Date  . Basal cell cancer   . Cancer [facility]) 03/2007   prostate Cancer s/p prostatectomy  . Erectile dysfunction   . Paroxysmal atrial fibrillation (HCC)    atrial fibrillation  . Seasonal allergies    Past Surgical History:  Procedure Laterality Date  . CARDIOVERSION  01/15/2012   Procedure: CARDIOVERSION;  Surgeon: Sueanne Margarita, MD;  Location: [place] OR;  Service: Cardiovascular;  Laterality: N/A;  . JOINT REPLACEMENT  10/2006   left knee replacement  . LAMINECTOMY  1982   L2/L3  . PROSTATECTOMY      ROS- all systems are reviewed and negatives except as per HPI above  Current Outpatient Medications  Medication Sig Dispense Refill  . Multiple Vitamin (MULTIVITAMIN) capsule Take 1 capsule by mouth daily.    . Omega-3 Fatty Acids (FISH OIL PO) Take 1 tablet by mouth every other day.    . diltiazem (CARDIZEM CD) 240 MG 24 hr capsule Take 1 capsule (240 mg total) by mouth daily. 90 capsule 3  . [START ON 10/10/2018] flecainide (TAMBOCOR) 50 MG tablet Take 1 tablet (50 mg total) by mouth 2 (two) times daily. 180 tablet 3   No current facility-administered medications for this visit.     Physical Exam: Vitals:   09/26/18 1640  BP: 126/72  Pulse: 85  SpO2: 97%  Weight: 174 lb 3.2 oz (79 kg)  Height: 6' (1.829 m)    GEN- The patient is well appearing, alert and oriented x 3 today.   Head- normocephalic, atraumatic Eyes-  Sclera clear, conjunctiva pink Ears- hearing intact Oropharynx- clear Lungs-  Clear to ausculation bilaterally, normal work of breathing Heart- Regular rate and rhythm, no murmurs, rubs or gallops, PMI not laterally displaced GI- soft, NT, ND, + BS Extremities- no clubbing, cyanosis, or edema  Wt Readings from Last 3 Encounters:  09/26/18 174 lb 3.2 oz (79 kg)  08/19/18 175 lb (79.4 kg)  08/08/18 175 lb 3.2 oz (79.5 kg)    EKG tracing ordered today is personally reviewed and shows sinus with PACs (frequent)  Echo 08/19/18 reveals EF 55%, severe LA enlargement  Assessment and Plan:  1. Paroxysmal atrial fibrillation S/p ablation at [facility] in 2014 He has done remarkably well, despite severe LA enlargement by volume. He has frequent PACs and nonsustained atach/ disorganized atrial activity. chads2vasc score is 0. Anticoagulation is not indicated Diltiazem added previously Increase diltiazem to 240mg  daily today Add flecainide 50mg  BID if still having symptoms in 2 weeks.  He can take an additional flecainide 50mg  bid prn for ongoing symptoms  2. SOB with activity resolved  Return in 4 weeks  Thompson Grayer MD, [facility] 09/26/2018 5:05 PM

## 2018-09-30 DIAGNOSIS — K635 Polyp of colon: Secondary | ICD-10-CM | POA: Diagnosis not present

## 2018-09-30 DIAGNOSIS — Z800 Family history of malignant neoplasm of digestive organs: Secondary | ICD-10-CM | POA: Diagnosis not present

## 2018-09-30 DIAGNOSIS — Z1211 Encounter for screening for malignant neoplasm of colon: Secondary | ICD-10-CM | POA: Diagnosis not present

## 2018-10-05 DIAGNOSIS — Z23 Encounter for immunization: Secondary | ICD-10-CM | POA: Diagnosis not present

## 2018-10-05 DIAGNOSIS — Z13220 Encounter for screening for lipoid disorders: Secondary | ICD-10-CM | POA: Diagnosis not present

## 2018-10-05 DIAGNOSIS — Z0000 Encounter for general adult medical examination without abnormal findings: Secondary | ICD-10-CM | POA: Diagnosis not present

## 2018-10-21 ENCOUNTER — Encounter: Payer: Self-pay | Admitting: Internal Medicine

## 2018-10-24 ENCOUNTER — Ambulatory Visit: Payer: 59 | Admitting: Internal Medicine

## 2018-10-24 ENCOUNTER — Encounter: Payer: Self-pay | Admitting: Internal Medicine

## 2018-10-24 VITALS — BP 118/72 | HR 81 | Ht 72.0 in | Wt 177.0 lb

## 2018-10-24 DIAGNOSIS — I480 Paroxysmal atrial fibrillation: Secondary | ICD-10-CM

## 2018-10-24 MED ORDER — FLECAINIDE ACETATE 100 MG PO TABS: 100.0000 mg | ORAL_TABLET | Freq: Two times a day (BID) | ORAL | 3 refills | Status: DC

## 2018-10-24 NOTE — Progress Notes (Signed)
   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  Since last being seen in our clinic, the patient reports doing very well.  He continues to have very frequent atrial ectopy.  + palpitations.  He is frustrataed by this.  Today, he denies symptoms of chest pain, shortness of breath,  lower extremity edema, dizziness, presyncope, or syncope.  The patient is otherwise without complaint today.   Past Medical History:  Diagnosis Date  . Basal cell cancer   . Cancer [facility]) 03/2007   prostate Cancer s/p prostatectomy  . Erectile dysfunction   . Paroxysmal atrial fibrillation (HCC)    atrial fibrillation  . Seasonal allergies    Past Surgical History:  Procedure Laterality Date  . CARDIOVERSION  01/15/2012   Procedure: CARDIOVERSION;  Surgeon: Sueanne Margarita, MD;  Location: [facility];  Service: Cardiovascular;  Laterality: N/A;  . JOINT REPLACEMENT  10/2006   left knee replacement  . LAMINECTOMY  1982   L2/L3  . PROSTATECTOMY      ROS- all systems are reviewed and negatives except as per HPI above  Current Outpatient Medications  Medication Sig Dispense Refill  . diltiazem (CARDIZEM CD) 240 MG 24 hr capsule Take 1 capsule (240 mg total) by mouth daily. 90 capsule 3  . flecainide (TAMBOCOR) 50 MG tablet Take 1 tablet (50 mg total) by mouth 2 (two) times daily. 180 tablet 3  . Multiple Vitamin (MULTIVITAMIN) capsule Take 1 capsule by mouth daily.    . Omega-3 Fatty Acids (FISH OIL PO) Take 1 tablet by mouth every other day.     No current facility-administered medications for this visit.     Physical Exam: Vitals:   10/24/18 1534  BP: 118/72  Pulse: 81  SpO2: 99%  Weight: 177 lb (80.3 kg)  Height: 6' (1.829 m)    GEN- The patient is well appearing, alert and oriented x 3 today.   Head- normocephalic, atraumatic Eyes-  Sclera clear, conjunctiva pink Ears- hearing intact Oropharynx-  clear Lungs- Clear to ausculation bilaterally, normal work of breathing Heart- Regular rate and rhythm with frequent ectopy, no murmurs, rubs or gallops, PMI not laterally displaced GI- soft, NT, ND, + BS Extremities- no clubbing, cyanosis, or edema  Wt Readings from Last 3 Encounters:  10/24/18 177 lb (80.3 kg)  09/26/18 174 lb 3.2 oz (79 kg)  08/19/18 175 lb (79.4 kg)    EKG tracing ordered today is personally reviewed and shows sinus rhythm with frequent atrial ectopics  Assessment and Plan:  1. Paroxysmal atrial fibrillation S/p ablation at [facility] in 2014. He has severe LA enlargement but doing well chads2vasc score is 0. Increase flecainide to 100mg  BID Return in 4 weeks If still having issues, we will consider tikosyn vs ablation at that time.   Thompson Grayer MD, [facility] 10/24/2018 3:40 PM

## 2018-10-24 NOTE — Patient Instructions (Addendum)
Medication Instructions:  Your physician has recommended you make the following change in your medication:   1.  Increase your flecainide 50 mg---Take 2 tablets by mouth twice a day.   Labwork: None ordered.  Testing/Procedures: None ordered.  Follow-Up: Your physician wants you to follow-up in: 4 weeks with Dr. Rayann Heman.   You will receive a reminder letter in the mail two months in advance. If you don't receive a letter, please call our office to schedule the follow-up appointment.  Any Other Special Instructions Will Be Listed Below (If Applicable).  If you need a refill on your cardiac medications before your next appointment, please call your pharmacy.

## 2018-11-07 ENCOUNTER — Telehealth: Payer: Self-pay | Admitting: *Deleted

## 2018-11-07 MED ORDER — FLECAINIDE ACETATE 100 MG PO TABS: 100.0000 mg | ORAL_TABLET | Freq: Two times a day (BID) | ORAL | 3 refills | Status: DC

## 2018-11-07 NOTE — Telephone Encounter (Signed)
Patient called in office for his refill on flecainide 50 mg tablet. Last office notes stated patient to take 50 mg tablet, 2 tablets Bid. Flecainide 100 mg tablet is still on patient med list can you please correct this and send to patient's pharmacy? Please address.

## 2018-11-25 ENCOUNTER — Ambulatory Visit: Payer: 59 | Admitting: Internal Medicine

## 2018-11-25 ENCOUNTER — Encounter: Payer: Self-pay | Admitting: Internal Medicine

## 2018-11-25 ENCOUNTER — Encounter (INDEPENDENT_AMBULATORY_CARE_PROVIDER_SITE_OTHER): Payer: Self-pay

## 2018-11-25 VITALS — BP 124/76 | HR 70 | Ht 72.0 in | Wt 177.2 lb

## 2018-11-25 DIAGNOSIS — I480 Paroxysmal atrial fibrillation: Secondary | ICD-10-CM | POA: Diagnosis not present

## 2018-11-25 NOTE — Patient Instructions (Addendum)
Medication Instructions:  Your physician recommends that you continue on your current medications as directed. Please refer to the Current Medication list given to you today.  Labwork: None ordered.  Testing/Procedures: None ordered.  Follow-Up: Your physician wants you to follow-up in: 4 months with Dr. Sherene Sires visit   Any Other Special Instructions Will Be Listed Below (If Applicable).  If you need a refill on your cardiac medications before your next appointment, please call your pharmacy.

## 2018-11-25 NOTE — Progress Notes (Signed)
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  Since last being seen in our clinic, the patient reports doing well overall. He reports that he has not noticed a difference in his palpitations on the higher dose of flecainide.  He denies chest pain, dyspnea, PND, orthopnea, nausea, vomiting, dizziness, syncope, edema, weight gain, or early satiety.  Past Medical History:  Diagnosis Date  . Basal cell cancer   . Cancer [facility]) 03/2007   prostate Cancer s/p prostatectomy  . Erectile dysfunction   . Paroxysmal atrial fibrillation (HCC)    atrial fibrillation  . Seasonal allergies    Past Surgical History:  Procedure Laterality Date  . CARDIOVERSION  01/15/2012   Procedure: CARDIOVERSION;  Surgeon: Sueanne Margarita, MD;  Location: [place] OR;  Service: Cardiovascular;  Laterality: N/A;  . JOINT REPLACEMENT  10/2006   left knee replacement  . LAMINECTOMY  1982   L2/L3  . PROSTATECTOMY      Current Outpatient Medications  Medication Sig Dispense Refill  . diltiazem (CARDIZEM CD) 240 MG 24 hr capsule Take 1 capsule (240 mg total) by mouth daily. 90 capsule 3  . flecainide (TAMBOCOR) 100 MG tablet Take 1 tablet (100 mg total) by mouth 2 (two) times daily. 180 tablet 3  . Multiple Vitamin (MULTIVITAMIN) capsule Take 1 capsule by mouth daily.    . Omega-3 Fatty Acids (FISH OIL PO) Take 1 tablet by mouth every other day.     No current facility-administered medications for this visit.     Allergies:   Patient has no known allergies.   Social History: Social History   Socioeconomic History  . Marital status: Married    Spouse name: Not on file  . Number of children: Not on file  . Years of education: Not on file  . Highest education level: Not  on file  Occupational History  . Not on file  Social Needs  . Financial resource strain: Not on file  . Food insecurity:    Worry: Not on file    Inability: Not on file  . Transportation needs:    Medical: Not on file    Non-medical: Not on file  Tobacco Use  . Smoking status: Never Smoker  . Smokeless tobacco: Never Used  Substance and Sexual Activity  . Alcohol use: Yes    Alcohol/week: 2.0 - 3.0 standard drinks    Types: 2 - 3 Cans of beer per week  . Drug use: Never  . Sexual activity: Not on file  Lifestyle  . Physical activity:    Days per week: Not on file    Minutes per session: Not on file  . Stress: Not on file  Relationships  . Social connections:    Talks on phone: Not on file    Gets together: Not on file    Attends religious service: Not on file    Active member of club or organization: Not on file    Attends meetings of clubs or organizations: Not on file    Relationship status: Not on file  . Intimate partner violence:    Fear of current or ex partner: Not on file    Emotionally abused: Not on file    Physically abused: Not on file  Forced sexual activity: Not on file  Other Topics Concern  . 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 Cancer Paternal Grandfather        prostate CA    Review of Systems: All other systems reviewed and are otherwise negative except as noted above.   Physical Exam: VS:  There were no vitals taken for this visit. , BMI There is no height or weight on file to calculate BMI. Wt Readings from Last 3 Encounters:  10/24/18 177 lb (80.3 kg)  09/26/18 174 lb 3.2 oz (79 kg)  08/19/18 175 lb (79.4 kg)    GEN- The patient is well appearing, alert and oriented x 3 today.   HEENT: normocephalic, atraumatic; sclera clear, conjunctiva pink; hearing intact; oropharynx clear; neck supple, no JVP Lymph- no cervical  lymphadenopathy Lungs- Clear to ausculation bilaterally, normal work of breathing.  No wheezes, rales, rhonchi Heart- Regular rate and rhythm, occasional ectopic beat, no murmurs, rubs or gallops, PMI not laterally displaced GI- soft, non-tender, non-distended, bowel sounds present, no hepatosplenomegaly Extremities- no clubbing, cyanosis, or edema; DP/PT/radial pulses 2+ bilaterally MS- no significant deformity or atrophy Skin- warm and dry, no rash or lesion  Psych- euthymic mood, full affect Neuro- strength and sensation are intact   EKG:  EKG is ordered today. The ekg ordered today shows SR HR 70, PACs, PR 160, QRS 106, QTc 434  Recent Labs: 08/08/2018: BUN 14; Creatinine, Ser 0.81; Hemoglobin 13.0; Platelets 412; Potassium 4.3; Sodium 137; TSH 3.550    Other studies Reviewed: Additional studies/ records that were reviewed today include:  Zio patch 07/13/18 Sinus rhythm 6 episodes of nonsustained ventricular tachycardia (longest 4 beats) Frequent episodes of disorganized atrial activity/ nonsustained afib.  Longest episode was 29 seconds, average heart rate during disorganized atrial activity 133 bpm No sustained atrial fibrillation  Echo 08/19/18 Left ventricle: The cavity size was mildly dilated. Wall   thickness was normal. Systolic function was normal. The estimated   ejection fraction was in the range of 55% to 60%. Wall motion was   normal; there were no regional wall motion abnormalities. - Mitral valve: There was mild regurgitation. - Left atrium: The atrium was severely dilated.  Nuclear stress test 08/19/18  Nuclear stress EF: 56%.  Blood pressure demonstrated a normal response to exercise.  There was no ST segment deviation noted during stress.  The study is normal.  This is a low risk study.  The left ventricular ejection fraction is normal (55-65%).  Assessment and Plan:  1. Paroxysmal atrial fibrillation S/p ablation at [facility] 2014 Flecainide recently  increased to 100 mg BID with minimal symptomatic improvement.  ECG stable No anticoagulation indicated at this time. We discussed various therapeutic options including watchful waiting, Tikosyn, and EP study with ablation. At this point, patient prefers to take his flecainide as a pill-in-pocket for his palpitations. I feel this is a reasonable plan.  This patients CHA2DS2-VASc Score and unadjusted Ischemic Stroke Rate (% per year) is equal to 0.2 % stroke rate/year from a score of 0  Above score calculated as 1 point each if present [CHF, HTN, DM, Vascular=MI/PAD/Aortic Plaque, Age if 65-74, or Male] Above score calculated as 2 points each if present [Age > 75, or Stroke/TIA/TE]    Current medicines are reviewed at length with the patient today.   The patient does not have concerns  regarding his medicines.  The following changes were made today:  Flecainide PRN  Labs/ tests ordered today include:  Orders Placed This Encounter  Procedures  . EKG 12-Lead     Disposition:   Follow up with me in 4 months   Army Fossa MD 11/25/2018 8:28 AM   [address] [place] [address] [phone] (office) [phone] (fax)

## 2019-01-11 DIAGNOSIS — M7912 Myalgia of auxiliary muscles, head and neck: Secondary | ICD-10-CM | POA: Diagnosis not present

## 2019-01-11 DIAGNOSIS — R51 Headache: Secondary | ICD-10-CM | POA: Diagnosis not present

## 2019-01-11 DIAGNOSIS — M5412 Radiculopathy, cervical region: Secondary | ICD-10-CM | POA: Diagnosis not present

## 2019-01-13 DIAGNOSIS — R51 Headache: Secondary | ICD-10-CM | POA: Diagnosis not present

## 2019-01-13 DIAGNOSIS — M5412 Radiculopathy, cervical region: Secondary | ICD-10-CM | POA: Diagnosis not present

## 2019-01-13 DIAGNOSIS — M7912 Myalgia of auxiliary muscles, head and neck: Secondary | ICD-10-CM | POA: Diagnosis not present

## 2019-05-04 ENCOUNTER — Telehealth: Payer: Self-pay | Admitting: Internal Medicine

## 2019-05-04 NOTE — Telephone Encounter (Signed)
New Message     Left voicemail for pt to call back to confirm appt and answer COVID Screening Questions

## 2019-05-05 ENCOUNTER — Ambulatory Visit: Payer: 59 | Admitting: Internal Medicine

## 2019-05-15 ENCOUNTER — Telehealth: Payer: Self-pay

## 2019-05-17 ENCOUNTER — Telehealth (INDEPENDENT_AMBULATORY_CARE_PROVIDER_SITE_OTHER): Payer: 59 | Admitting: Internal Medicine

## 2019-05-17 DIAGNOSIS — I480 Paroxysmal atrial fibrillation: Secondary | ICD-10-CM

## 2019-05-17 NOTE — Progress Notes (Signed)
Electrophysiology TeleHealth Note   Due to national recommendations of social distancing due to COVID 19, an audio/video telehealth visit is felt to be most appropriate for this patient at this time.  See MyChart message from today for the patient's consent to telehealth for [facility]   Date:  05/17/2019   ID:  Ethan Peach, Ethan Powers, DOB Aug 24, 1961, MRN [id number]  Location: patient's office in [facility]  Provider location:  [facility]  Evaluation Performed: Follow-up visit  PCP:  Gaynelle Arabian, Ethan Powers   Electrophysiologist:  Dr Rayann Heman  Chief Complaint:  palpitations  History of Present Illness:    Ethan Peach, Ethan Powers is a 58 y.o. male who presents via telehealth conferencing today.  Since last being seen in our clinic, the patient reports doing very well.  He dramatically reduced his work in March and April and noticed that his palpitations resolved.  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  Today, he denies symptoms of chest pain, shortness of breath,  lower extremity edema, dizziness, presyncope, or syncope.  The patient is otherwise without complaint today.  The patient denies symptoms of fevers, chills, cough, or new SOB worrisome for COVID 19.  Past Medical History:  Diagnosis Date   Basal cell cancer    Cancer ([place]) 03/2007   prostate Cancer s/p prostatectomy   Erectile dysfunction    Paroxysmal atrial fibrillation (HCC)    atrial fibrillation   Seasonal allergies     Past Surgical History:  Procedure Laterality Date   CARDIOVERSION  01/15/2012   Procedure: CARDIOVERSION;  Surgeon: Sueanne Margarita, Ethan Powers;  Location: [place];  Service: Cardiovascular;  Laterality: N/A;   JOINT REPLACEMENT  10/2006   left knee replacement   LAMINECTOMY  1982   L2/L3   PROSTATECTOMY      Current Outpatient Medications  Medication Sig Dispense Refill   diltiazem (CARDIZEM CD) 240 MG 24 hr capsule Take 1 capsule (240 mg total) by mouth daily. 90 capsule  3   flecainide (TAMBOCOR) 100 MG tablet Take 1 tablet (100 mg total) by mouth 2 (two) times daily. 180 tablet 3   Multiple Vitamin (MULTIVITAMIN) capsule Take 1 capsule by mouth daily.     Omega-3 Fatty Acids (FISH OIL PO) Take 1 tablet by mouth every other day.     No current facility-administered medications for this visit.     Allergies:   Patient has no known allergies.   Social History:  The patient  reports that he has never smoked. He has never used smokeless tobacco. He reports current alcohol use of about 2.0 - 3.0 standard drinks of alcohol per week. He reports that he does not use drugs.   Family History:  The patient's  family history includes Cancer in his paternal grandfather.   ROS:  Please see the history of present illness.   All other systems are personally reviewed and negative.    Exam:    Vital Signs:  There were no vitals taken for this visit.  Well appearing today, NAD, OP clear, normal WOB   Labs/Other Tests and Data Reviewed:    Recent Labs: 08/08/2018: BUN 14; Creatinine, Ser 0.81; Hemoglobin 13.0; Platelets 412; Potassium 4.3; Sodium 137; TSH 3.550   Wt Readings from Last 3 Encounters:  11/25/18 177 lb 3.2 oz (80.4 kg)  10/24/18 177 lb (80.3 kg)  09/26/18 174 lb 3.2 oz (79 kg)       ASSESSMENT & PLAN:    1.  Paroxysmal  atrial fibrillation Doing well at this time using flecainide as a pill in pocket option. He had ablation at [facility] in 2014. chads2vasc score is 0.  Not on [facility] therapy   Follow-up:  6 months with me   Patient Risk:  after full review of this patients clinical status, I feel that they are at moderate risk at this time.  Today, I have spent 15 minutes with the patient with telehealth technology discussing arrhythmia management .    Army Fossa, Ethan Powers  05/17/2019 11:43 AM     [facility] [address] [place] [address] [phone] (office) [phone] (fax)

## 2019-09-07 ENCOUNTER — Telehealth: Payer: Self-pay | Admitting: Internal Medicine

## 2019-09-07 NOTE — Telephone Encounter (Signed)
The patient states his HR was elevated last night at dinner. He took his PM dose of flecainide and his HR decreased to 70 around 2100. He went to bed and everything was fine. He woke up this morning and his HR was again elevated in the 130s.  He took his scheduled flecainide and cardizem around 0600 this AM and his HR is still 136.  He is asymptomatic other than he feels a little tired. Discussed with Afib Clinic. Instructed the patient to take flecainide 100 mg at lunch. He will monitor HR and symptoms and will call in the AM if there is no improvement.  Gave him number to the Afib Clinic. He was grateful for call and agrees with treatment plan.

## 2019-09-07 NOTE — Telephone Encounter (Signed)
New Message     STAT if HR is under 50 or over 120 (normal HR is 60-100 beats per minute)  1) What is your heart rate? Last night HR 146, took Flecainide and brought it down to High 70's  This morning woke up HR 130 and took Flecainide and Cardizem and it is still HR 136   2) Do you have a log of your heart rate readings (document readings)? No he says for months they have been great   3) Do you have any other symptoms? Dizziness and feels tired

## 2019-09-08 ENCOUNTER — Telehealth: Payer: Self-pay

## 2019-09-08 ENCOUNTER — Encounter (HOSPITAL_COMMUNITY): Payer: Self-pay

## 2019-09-08 MED ORDER — RIVAROXABAN 20 MG PO TABS: 20.0000 mg | ORAL_TABLET | Freq: Every day | ORAL | 3 refills | Status: DC

## 2019-09-08 MED ORDER — DILTIAZEM HCL ER COATED BEADS 120 MG PO CP24: 120.0000 mg | ORAL_CAPSULE | Freq: Every day | ORAL | 3 refills | Status: DC

## 2019-09-08 NOTE — Telephone Encounter (Signed)
Discussed with Adline Peals PA will increase cardizem to 120mg  in the PM in addition to the 240mg  in the AM. Restart Xarelto 20mg  once a day with supper with follow up on Monday. ER precautions reviewed. Pt just fatigued decreased exercise tolerance otherwise asymptomatic.  Pt in agreement.

## 2019-09-08 NOTE — Telephone Encounter (Signed)
I spoke to the patient and told him that you may be reaching out to him for further advisement.  Thank you

## 2019-09-08 NOTE — Telephone Encounter (Signed)
° ° °  STAT if HR is under 50 or over 120 (normal HR is 60-100 beats per minute)  What is your heart rate? 146 1) Do you have a log of your heart rate readings (document readings)? 139-->153  Do you have any other symptoms? tired

## 2019-09-08 NOTE — Telephone Encounter (Signed)
lpmtcb 10/30

## 2019-09-08 NOTE — Telephone Encounter (Signed)
I spoke to the patient and reviewed Afib Clinic's recommendations.  He verbalized understanding and thanked me for the call.

## 2019-09-11 ENCOUNTER — Ambulatory Visit (HOSPITAL_COMMUNITY)
Admission: RE | Admit: 2019-09-11 | Discharge: 2019-09-11 | Disposition: A | Discharge status: Home / Self Care | Payer: 59 | Source: Ambulatory Visit | Attending: Nurse Practitioner | Admitting: Nurse Practitioner

## 2019-09-11 ENCOUNTER — Encounter (HOSPITAL_COMMUNITY): Payer: Self-pay | Admitting: Nurse Practitioner

## 2019-09-11 ENCOUNTER — Other Ambulatory Visit: Payer: Self-pay

## 2019-09-11 VITALS — BP 136/80 | HR 108 | Ht 72.0 in | Wt 178.6 lb

## 2019-09-11 DIAGNOSIS — I480 Paroxysmal atrial fibrillation: Secondary | ICD-10-CM | POA: Insufficient documentation

## 2019-09-11 DIAGNOSIS — I4892 Unspecified atrial flutter: Secondary | ICD-10-CM | POA: Diagnosis not present

## 2019-09-11 DIAGNOSIS — I484 Atypical atrial flutter: Secondary | ICD-10-CM | POA: Insufficient documentation

## 2019-09-11 DIAGNOSIS — Z79899 Other long term (current) drug therapy: Secondary | ICD-10-CM | POA: Insufficient documentation

## 2019-09-11 NOTE — Progress Notes (Signed)
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 male with a h/o afib ablation, in 2014 at [facility],  flecainide/CCB use.   He has been bothered by frequent PAC's. Dr. Rayann Heman increased flecainide to 100 mg bid in September 2019 for this reason.  At that time he stated that if afib burden increases he would recommend tikosyn or ablation.  Today, the pt is in the afib clinic for being out of rhythm since 10/29. He had RVR.Cardizem was increased on Friday and xarelto 20 mg was restarted. He is better rate controlled today at 108 bpm but still can see elevation of HR's with activity.He feels he is tolerating the v rates ok. No fluid retention or shortness of breath, just fatigue. He would prefer going to ablation vrs having a change in antiarrythmic.  He would also like to defer DCCV at this time.   Today, he denies symptoms of palpitations, chest pain, shortness of breath, orthopnea, PND, lower extremity edema, dizziness, presyncope, syncope, or neurologic sequela. The patient is tolerating medications without difficulties and is otherwise without complaint today.   Past Medical History:  Diagnosis Date  . Basal cell cancer   . Cancer [facility]) 03/2007   prostate Cancer s/p prostatectomy  . Erectile dysfunction   . Paroxysmal atrial fibrillation (HCC)    atrial fibrillation  . Seasonal allergies    Past Surgical History:  Procedure Laterality Date  . CARDIOVERSION  01/15/2012   Procedure: CARDIOVERSION;  Surgeon: Sueanne Margarita, MD;  Location: [place] OR;  Service: Cardiovascular;  Laterality: N/A;  . JOINT REPLACEMENT  10/2006   left knee replacement  . LAMINECTOMY  1982   L2/L3  . PROSTATECTOMY      Current Outpatient Medications  Medication Sig Dispense Refill  . CARTIA XT 240 MG 24 hr capsule Take 240 mg by mouth 2 (two) times daily.    . flecainide (TAMBOCOR) 100 MG tablet Take 1 tablet (100 mg total) by mouth 2 (two) times daily. 180  tablet 3  . Multiple Vitamin (MULTIVITAMIN) capsule Take 1 capsule by mouth daily.    . Omega-3 Fatty Acids (FISH OIL PO) Take 1 tablet by mouth every other day.    . rivaroxaban (XARELTO) 20 MG TABS tablet Take 1 tablet (20 mg total) by mouth daily with supper. 30 tablet 3   No current facility-administered medications for this encounter.     No Known Allergies  Social History   Socioeconomic History  . Marital status: Married    Spouse name: Not on file  . Number of children: Not on file  . Years of education: Not on file  . Highest education level: Not on file  Occupational History  . Not on file  Social Needs  . Financial resource strain: Not on file  . Food insecurity    Worry: Not on file    Inability: Not on file  . Transportation needs    Medical: Not on file    Non-medical: Not on file  Tobacco Use  . Smoking status: Never Smoker  . Smokeless tobacco: Never Used  Substance and Sexual Activity  . Alcohol use: Yes    Alcohol/week: 2.0 - 3.0 standard drinks    Types: 2 - 3 Cans of beer per week  . Drug use: Never  . Sexual activity: Not on file  Lifestyle  . Physical activity    Days per week: Not on file  Minutes per session: Not on file  . Stress: Not on file  Relationships  . Social Herbalist on phone: Not on file    Gets together: Not on file    Attends religious service: Not on file    Active member of club or organization: Not on file    Attends meetings of clubs or organizations: Not on file    Relationship status: Not on file  . Intimate partner violence    Fear of current or ex partner: Not on file    Emotionally abused: Not on file    Physically abused: Not on file    Forced sexual activity: Not on file  Other Topics Concern  . 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 Cancer Paternal Grandfather        prostate CA    ROS- All systems are  reviewed and negative except as per the HPI above  Physical Exam: Vitals:   09/11/19 0840  BP: 136/80  Pulse: (!) 108  Weight: 81 kg  Height: 6' (1.829 m)   Wt Readings from Last 3 Encounters:  09/11/19 81 kg  11/25/18 80.4 kg  10/24/18 80.3 kg    Labs: Lab Results  Component Value Date   NA 137 08/08/2018   K 4.3 08/08/2018   CL 101 08/08/2018   CO2 24 08/08/2018   GLUCOSE 84 08/08/2018   BUN 14 08/08/2018   CREATININE 0.81 08/08/2018   CALCIUM 9.4 08/08/2018   No results found for: INR No results found for: CHOL, HDL, LDLCALC, TRIG   GEN- The patient is well appearing, alert and oriented x 3 today.   Head- normocephalic, atraumatic Eyes-  Sclera clear, conjunctiva pink Ears- hearing intact Oropharynx- clear Neck- supple, no JVP Lymph- no cervical lymphadenopathy Lungs- Clear to ausculation bilaterally, normal work of breathing Heart- irregular rate and rhythm, no murmurs, rubs or gallops, PMI not laterally displaced GI- soft, NT, ND, + BS Extremities- no clubbing, cyanosis, or edema MS- no significant deformity or atrophy Skin- no rash or lesion Psych- euthymic mood, full affect Neuro- strength and sensation are intact  EKG- atrial flutter with variable block at 108 bpm, qrs int 106 ms, qtc 415 ms Epic records reviewed Echo- 08/2018-Study Conclusions  - Left ventricle: The cavity size was mildly dilated. Wall   thickness was normal. Systolic function was normal. The estimated   ejection fraction was in the range of 55% to 60%. Wall motion was   normal; there were no regional wall motion abnormalities. - Mitral valve: There was mild regurgitation. - Left atrium: The atrium was severely dilated.  Impressions:  - Normal LV function; mild MR; severe LAE.   Assessment and Plan: 1. Aflutter with variable block  Has been persistent x 5 days He would like to have repeat ablation and defer DCCV or change to tikosyn for now   I will increase cardizem to  240 mg bid for better rate control He will watch for weight gain or exertional dyspnea He will check BP/HR's over the next 2 days and call the office on Thursday with these numbers and how he is doing overall We will request a virtual visit with Dr. Rayann Heman to discuss ablation   2. CHA2DS2VASc of 0 He will continue xarelto 20 mg daily for a chadsvasc score of 0 for now Started back on 10/30 He  will be on xarelto x 3 weeks on 11/20   Butch Penny C. Kareem Cathey, [facility] [address] [place], [place] [address] [phone]

## 2019-09-14 ENCOUNTER — Telehealth (HOSPITAL_COMMUNITY): Payer: Self-pay | Admitting: *Deleted

## 2019-09-14 NOTE — Telephone Encounter (Signed)
Patient called with update of HR with increased cardizem -- HR in the 70s at rest with exertion will go up to 120s but does recover to 70s. Per Roderic Palau NP continue current regimen follow up with Dr. Rayann Heman as scheduled.

## 2019-09-22 ENCOUNTER — Other Ambulatory Visit: Payer: Self-pay

## 2019-09-22 ENCOUNTER — Telehealth: Payer: Self-pay

## 2019-09-22 ENCOUNTER — Encounter: Payer: Self-pay | Admitting: Internal Medicine

## 2019-09-22 ENCOUNTER — Telehealth (INDEPENDENT_AMBULATORY_CARE_PROVIDER_SITE_OTHER): Payer: 59 | Admitting: Internal Medicine

## 2019-09-22 DIAGNOSIS — I4819 Other persistent atrial fibrillation: Secondary | ICD-10-CM

## 2019-09-22 MED ORDER — METOPROLOL TARTRATE 100 MG PO TABS: ORAL_TABLET | ORAL | 0 refills | Status: DC

## 2019-09-22 NOTE — Telephone Encounter (Signed)
Call placed to Pt.  Pt scheduled for afib ablation on October 03, 2019 at 1:30 pm.  Will get labs 09/25/2019 at 3:45 pm  Covid test on September 29, 2019 at 8:00 am  Attempting to schedule cardiac CT-continue to follow

## 2019-09-22 NOTE — H&P (View-Only) (Signed)
Electrophysiology TeleHealth Note  Due to national recommendations of social distancing due to [place] 19, an audio telehealth visit is felt to be most appropriate for this patient at this time.  Verbal consent was obtained by me for the telehealth visit today.  The patient does not have capability for a virtual visit.  A phone visit is therefore required today.   Date:  09/22/2019   ID:  Ethan Peach, Ethan Powers, DOB [birth year], MRN [id number]  Location: patient's home  Provider location:  [facility]  Evaluation Performed: Follow-up visit  PCP:  Gaynelle Arabian, Ethan Powers   Electrophysiologist:  Dr Rayann Heman  Chief Complaint:  palpitations  History of Present Illness:    Ethan Peach, Ethan Powers is a 58 y.o. male who presents via telehealth conferencing today.  Since last being seen in our clinic, the patient reports doing reasonably well.  He went into afib about 2 weeks ago which has been persistent.  + SOB and decreased exercise tolerance. + mild edema.  Today, he denies symptoms of palpitations, chest pain, dizziness, presyncope, or syncope.  The patient is otherwise without complaint today.  The patient denies symptoms of fevers, chills, cough, or new SOB worrisome for COVID 19.  Past Medical History:  Diagnosis Date  . Basal cell cancer   . Cancer [facility]) 03/2007   prostate Cancer s/p prostatectomy  . Erectile dysfunction   . Paroxysmal atrial fibrillation ([place])    s/p PVI at [facility] in 2014  . Seasonal allergies     Past Surgical History:  Procedure Laterality Date  . CARDIOVERSION  01/15/2012   Procedure: CARDIOVERSION;  Surgeon: Sueanne Margarita, Ethan Powers;  Location: [place] [facility];  Service: Cardiovascular;  Laterality: N/A;  . JOINT REPLACEMENT  10/2006   left knee replacement  . LAMINECTOMY  1982   L2/L3  . PROSTATECTOMY      Current Outpatient Medications  Medication Sig Dispense Refill  . CARTIA XT 240 MG 24 hr capsule Take 240 mg by mouth 2 (two) times daily.    . flecainide (TAMBOCOR) 100 MG  tablet Take 1 tablet (100 mg total) by mouth 2 (two) times daily. 180 tablet 3  . Multiple Vitamin (MULTIVITAMIN) capsule Take 1 capsule by mouth daily.    . Omega-3 Fatty Acids (FISH OIL PO) Take 1 tablet by mouth every other day.    . rivaroxaban (XARELTO) 20 MG TABS tablet Take 1 tablet (20 mg total) by mouth daily with supper. 30 tablet 3   No current facility-administered medications for this visit.     Allergies:   Patient has no known allergies.   Social History:  The patient  reports that he has never smoked. He has never used smokeless tobacco. He reports current alcohol use of about 2.0 - 3.0 standard drinks of alcohol per week. He reports that he does not use drugs.   Family History:  The patient's family history includes Cancer in his paternal grandfather.   ROS:  Please see the history of present illness.   All other systems are personally reviewed and negative.    Exam:    Vital Signs:  There were no vitals taken for this visit.  Well sounding, alert and conversant   Labs/Other Tests and Data Reviewed:    Recent Labs: No results found for requested labs within last 8760 hours.   Wt Readings from Last 3 Encounters:  09/11/19 178 lb 9.6 oz (81 kg)  11/25/18 177 lb 3.2 oz (80.4 kg)  10/24/18 177 lb (  80.3 kg)     ASSESSMENT & PLAN:    1.  Persistent atrial fibrillation The patient has symptomatic, recurrent persistent atrial fibrillation.  He is s/p PVI he has failed medical therapy with flecainide. he is anticoagulated with xarelto.  Therapeutic strategies for afib including medicine and repeat ablation were discussed in detail with the patient today. Risk, benefits, and alternatives to EP study and radiofrequency ablation for afib were also discussed in detail today. These risks include but are not limited to stroke, bleeding, vascular damage, tamponade, perforation, damage to the esophagus, lungs, and other structures, pulmonary vein stenosis, worsening renal  function, and death. The patient understands these risk and wishes to proceed.  We will therefore proceed with catheter ablation at the next available time.  Carto, ICE, anesthesia are requested for the procedure.  Will also obtain cardiac CT prior to the procedure to exclude LAA thrombus and further evaluate atrial anatomy.   Patient Risk:  after full review of this patients clinical status, I feel that they are at high risk at this time.  Today, I have spent 25 minutes with the patient with telehealth technology discussing arrhythmia management .    Army Fossa, Ethan Powers  09/22/2019 2:02 PM     [place] [address] [phone] (office) [phone] (fax)

## 2019-09-22 NOTE — Progress Notes (Signed)
Electrophysiology TeleHealth Note  Due to national recommendations of social distancing due to [place] 19, an audio telehealth visit is felt to be most appropriate for this patient at this time.  Verbal consent was obtained by me for the telehealth visit today.  The patient does not have capability for a virtual visit.  A phone visit is therefore required today.   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  Since last being seen in our clinic, the patient reports doing reasonably well.  He went into afib about 2 weeks ago which has been persistent.  + SOB and decreased exercise tolerance. + mild edema.  Today, he denies symptoms of palpitations, chest pain, dizziness, presyncope, or syncope.  The patient is otherwise without complaint today.  The patient denies symptoms of fevers, chills, cough, or new SOB worrisome for COVID 19.  Past Medical History:  Diagnosis Date  . Basal cell cancer   . Cancer [facility]) 03/2007   prostate Cancer s/p prostatectomy  . Erectile dysfunction   . Paroxysmal atrial fibrillation ([place])    s/p PVI at [facility] in 2014  . Seasonal allergies     Past Surgical History:  Procedure Laterality Date  . CARDIOVERSION  01/15/2012   Procedure: CARDIOVERSION;  Surgeon: Sueanne Margarita, MD;  Location: [place] [facility];  Service: Cardiovascular;  Laterality: N/A;  . JOINT REPLACEMENT  10/2006   left knee replacement  . LAMINECTOMY  1982   L2/L3  . PROSTATECTOMY      Current Outpatient Medications  Medication Sig Dispense Refill  . CARTIA XT 240 MG 24 hr capsule Take 240 mg by mouth 2 (two) times daily.    . flecainide (TAMBOCOR) 100 MG  tablet Take 1 tablet (100 mg total) by mouth 2 (two) times daily. 180 tablet 3  . Multiple Vitamin (MULTIVITAMIN) capsule Take 1 capsule by mouth daily.    . Omega-3 Fatty Acids (FISH OIL PO) Take 1 tablet by mouth every other day.    . rivaroxaban (XARELTO) 20 MG TABS tablet Take 1 tablet (20 mg total) by mouth daily with supper. 30 tablet 3   No current facility-administered medications for this visit.     Allergies:   Patient has no known allergies.   Social History:  The patient  reports that he has never smoked. He has never used smokeless tobacco. He reports current alcohol use of about 2.0 - 3.0 standard drinks of alcohol per week. He reports that he does not use drugs.   Family History:  The patient's family history includes Cancer in his paternal grandfather.   ROS:  Please see the history of present illness.   All other systems are personally reviewed and negative.    Exam:    Vital Signs:  There were no vitals taken for this visit.  Well sounding, alert and conversant   Labs/Other Tests and Data Reviewed:    Recent Labs: No results found for requested labs within last 8760 hours.   Wt Readings from Last 3 Encounters:  09/11/19 178 lb 9.6 oz (81 kg)  11/25/18 177 lb 3.2 oz (80.4 kg)  10/24/18 177 lb (  80.3 kg)     ASSESSMENT & PLAN:    1.  Persistent atrial fibrillation The patient has symptomatic, recurrent persistent atrial fibrillation.  He is s/p PVI he has failed medical therapy with flecainide. he is anticoagulated with xarelto.  Therapeutic strategies for afib including medicine and repeat ablation were discussed in detail with the patient today. Risk, benefits, and alternatives to EP study and radiofrequency ablation for afib were also discussed in detail today. These risks include but are not limited to stroke, bleeding, vascular damage, tamponade, perforation, damage to the esophagus, lungs, and other structures, pulmonary vein stenosis, worsening renal  function, and death. The patient understands these risk and wishes to proceed.  We will therefore proceed with catheter ablation at the next available time.  Carto, ICE, anesthesia are requested for the procedure.  Will also obtain cardiac CT prior to the procedure to exclude LAA thrombus and further evaluate atrial anatomy.   Patient Risk:  after full review of this patients clinical status, I feel that they are at high risk at this time.  Today, I have spent 25 minutes with the patient with telehealth technology discussing arrhythmia management .    Army Fossa, MD  09/22/2019 2:02 PM     [place] [address] [phone] (office) [phone] (fax)

## 2019-09-22 NOTE — Telephone Encounter (Signed)
-----   Message from Thompson Grayer, MD sent at 09/22/2019  2:06 PM EST ----- Afib ablation C/I/A  Cardiac CT  He is in RVR and worries about staying in afib.  Would like to get done sooner than later.  Could possibly do as a third case.  He is a Pharmacist, community.

## 2019-09-22 NOTE — Telephone Encounter (Signed)
Cardiac CT scheduled.  Work up complete.  Instruction letters released via Mychart.

## 2019-09-25 ENCOUNTER — Other Ambulatory Visit: Payer: Self-pay

## 2019-09-25 ENCOUNTER — Other Ambulatory Visit: Payer: Self-pay | Admitting: Internal Medicine

## 2019-09-25 ENCOUNTER — Other Ambulatory Visit: Payer: 59 | Admitting: *Deleted

## 2019-09-25 DIAGNOSIS — I4819 Other persistent atrial fibrillation: Secondary | ICD-10-CM

## 2019-09-25 LAB — CBC WITH DIFFERENTIAL/PLATELET
Basophils Absolute: 0 10*3/uL (ref 0.0–0.2)
Basos: 0 %
EOS (ABSOLUTE): 0 10*3/uL (ref 0.0–0.4)
Eos: 0 %
Hematocrit: 37.4 % — ABNORMAL LOW (ref 37.5–51.0)
Hemoglobin: 13.1 g/dL (ref 13.0–17.7)
Lymphocytes Absolute: 1.5 10*3/uL (ref 0.7–3.1)
Lymphs: 19 %
MCH: 32.6 pg (ref 26.6–33.0)
MCHC: 35 g/dL (ref 31.5–35.7)
MCV: 93 fL (ref 79–97)
Monocytes Absolute: 1.3 10*3/uL — ABNORMAL HIGH (ref 0.1–0.9)
Monocytes: 16 %
Neutrophils Absolute: 5 10*3/uL (ref 1.4–7.0)
Neutrophils: 65 %
Platelets: 492 10*3/uL — ABNORMAL HIGH (ref 150–450)
RBC: 4.02 x10E6/uL — ABNORMAL LOW (ref 4.14–5.80)
RDW: 13.8 % (ref 11.6–15.4)
WBC: 7.9 10*3/uL (ref 3.4–10.8)

## 2019-09-25 LAB — BASIC METABOLIC PANEL
BUN/Creatinine Ratio: 15 (ref 10–24)
BUN: 14 mg/dL (ref 8–27)
CO2: 26 mmol/L (ref 20–29)
Calcium: 9 mg/dL (ref 8.6–10.2)
Chloride: 99 mmol/L (ref 96–106)
Creatinine, Ser: 0.94 mg/dL (ref 0.76–1.27)
GFR calc Af Amer: 101 mL/min/{1.73_m2} (ref >59)
GFR calc non Af Amer: 88 mL/min/{1.73_m2} (ref >59)
Glucose: 121 mg/dL — ABNORMAL HIGH (ref 65–99)
Potassium: 4.1 mmol/L (ref 3.5–5.2)
Sodium: 135 mmol/L (ref 134–144)

## 2019-09-27 ENCOUNTER — Telehealth (HOSPITAL_COMMUNITY): Payer: Self-pay | Admitting: Emergency Medicine

## 2019-09-27 NOTE — Telephone Encounter (Signed)
Reaching out to patient to offer assistance regarding upcoming cardiac imaging study; pt verbalizes understanding of appt date/time, parking situation and where to check in, pre-test NPO status and medications ordered, and verified current allergies; name and call back number provided for further questions should they arise Ethan Hobbs RN Navigator Cardiac Imaging [facility] [phone] office [phone] cell 

## 2019-09-28 ENCOUNTER — Other Ambulatory Visit: Payer: Self-pay

## 2019-09-28 ENCOUNTER — Telehealth (HOSPITAL_COMMUNITY): Payer: Self-pay | Admitting: Emergency Medicine

## 2019-09-28 ENCOUNTER — Ambulatory Visit (HOSPITAL_COMMUNITY)
Admission: RE | Admit: 2019-09-28 | Discharge: 2019-09-28 | Disposition: A | Discharge status: Home / Self Care | Payer: 59 | Source: Ambulatory Visit | Attending: Internal Medicine | Admitting: Internal Medicine

## 2019-09-28 DIAGNOSIS — I4819 Other persistent atrial fibrillation: Secondary | ICD-10-CM | POA: Diagnosis not present

## 2019-09-28 MED ORDER — IOHEXOL 350 MG/ML SOLN
80.0000 mL | Freq: Once | INTRAVENOUS | Status: AC | PRN
  Administered 2019-09-28: 80 mL via INTRAVENOUS

## 2019-09-28 NOTE — Telephone Encounter (Signed)
Calling to offer patient earlier appt time slot as we have had 2 cancellations. Patient states he is not available and would like to keep original appt  Marchia Bond RN Navigator Cardiac [facility] [phone] Office  [phone] Cell

## 2019-09-29 ENCOUNTER — Other Ambulatory Visit (HOSPITAL_COMMUNITY): Payer: 59

## 2019-09-30 ENCOUNTER — Other Ambulatory Visit (HOSPITAL_COMMUNITY)
Admission: RE | Admit: 2019-09-30 | Discharge: 2019-09-30 | Disposition: A | Discharge status: Home / Self Care | Payer: 59 | Source: Ambulatory Visit | Attending: Internal Medicine | Admitting: Internal Medicine

## 2019-09-30 DIAGNOSIS — Z01812 Encounter for preprocedural laboratory examination: Secondary | ICD-10-CM | POA: Diagnosis not present

## 2019-09-30 DIAGNOSIS — Z20828 Contact with and (suspected) exposure to other viral communicable diseases: Secondary | ICD-10-CM | POA: Insufficient documentation

## 2019-10-02 LAB — NOVEL CORONAVIRUS, NAA (HOSP ORDER, SEND-OUT TO REF LAB; TAT 18-24 HRS): SARS-CoV-2, NAA: NOT DETECTED

## 2019-10-03 ENCOUNTER — Other Ambulatory Visit: Payer: Self-pay

## 2019-10-03 ENCOUNTER — Encounter (HOSPITAL_COMMUNITY): Payer: Self-pay | Admitting: Anesthesiology

## 2019-10-03 ENCOUNTER — Ambulatory Visit (HOSPITAL_COMMUNITY): Payer: 59 | Admitting: Anesthesiology

## 2019-10-03 ENCOUNTER — Encounter (HOSPITAL_COMMUNITY)
Admission: RE | Disposition: A | Discharge status: Home / Self Care | Payer: 59 | Source: Home / Self Care | Attending: Internal Medicine

## 2019-10-03 ENCOUNTER — Ambulatory Visit (HOSPITAL_COMMUNITY)
Admission: RE | Admit: 2019-10-03 | Discharge: 2019-10-03 | Disposition: A | Discharge status: Home / Self Care | Payer: 59 | Attending: Internal Medicine | Admitting: Internal Medicine

## 2019-10-03 DIAGNOSIS — Z79899 Other long term (current) drug therapy: Secondary | ICD-10-CM | POA: Insufficient documentation

## 2019-10-03 DIAGNOSIS — I4892 Unspecified atrial flutter: Secondary | ICD-10-CM | POA: Insufficient documentation

## 2019-10-03 DIAGNOSIS — Z7901 Long term (current) use of anticoagulants: Secondary | ICD-10-CM | POA: Diagnosis not present

## 2019-10-03 DIAGNOSIS — Z9079 Acquired absence of other genital organ(s): Secondary | ICD-10-CM | POA: Diagnosis not present

## 2019-10-03 DIAGNOSIS — I4819 Other persistent atrial fibrillation: Secondary | ICD-10-CM | POA: Diagnosis not present

## 2019-10-03 DIAGNOSIS — I4891 Unspecified atrial fibrillation: Secondary | ICD-10-CM | POA: Diagnosis not present

## 2019-10-03 HISTORY — PX: ATRIAL FIBRILLATION ABLATION: EP1191

## 2019-10-03 LAB — POCT ACTIVATED CLOTTING TIME
Activated Clotting Time: 213 seconds
Activated Clotting Time: 257 seconds
Activated Clotting Time: 235 seconds
Activated Clotting Time: 252 seconds

## 2019-10-03 SURGERY — ATRIAL FIBRILLATION ABLATION
Anesthesia: General

## 2019-10-03 MED ORDER — HEPARIN (PORCINE) IN NACL 1000-0.9 UT/500ML-% IV SOLN
INTRAVENOUS | Status: DC | PRN
  Administered 2019-10-03: 500 mL

## 2019-10-03 MED ORDER — SODIUM CHLORIDE 0.9 % IV SOLN
INTRAVENOUS | Status: DC
  Administered 2019-10-03 (×2): via INTRAVENOUS

## 2019-10-03 MED ORDER — ONDANSETRON HCL 4 MG/2ML IJ SOLN: 4.0000 mg | Freq: Four times a day (QID) | INTRAMUSCULAR | Status: DC | PRN

## 2019-10-03 MED ORDER — SUGAMMADEX SODIUM 200 MG/2ML IV SOLN
INTRAVENOUS | Status: DC | PRN
  Administered 2019-10-03: 100 mg via INTRAVENOUS

## 2019-10-03 MED ORDER — PROTAMINE SULFATE 10 MG/ML IV SOLN
INTRAVENOUS | Status: DC | PRN
  Administered 2019-10-03: 40 mg via INTRAVENOUS

## 2019-10-03 MED ORDER — PHENYLEPHRINE HCL-NACL 10-0.9 MG/250ML-% IV SOLN
INTRAVENOUS | Status: DC | PRN
  Administered 2019-10-03: 50 ug/min via INTRAVENOUS

## 2019-10-03 MED ORDER — LIDOCAINE 2% (20 MG/ML) 5 ML SYRINGE
INTRAMUSCULAR | Status: DC | PRN
  Administered 2019-10-03: 100 mg via INTRAVENOUS

## 2019-10-03 MED ORDER — FENTANYL CITRATE (PF) 100 MCG/2ML IJ SOLN
INTRAMUSCULAR | Status: DC | PRN
  Administered 2019-10-03 (×2): 50 ug via INTRAVENOUS

## 2019-10-03 MED ORDER — ROCURONIUM BROMIDE 10 MG/ML (PF) SYRINGE
PREFILLED_SYRINGE | INTRAVENOUS | Status: DC | PRN
  Administered 2019-10-03 (×2): 20 mg via INTRAVENOUS
  Administered 2019-10-03 (×2): 10 mg via INTRAVENOUS
  Administered 2019-10-03: 70 mg via INTRAVENOUS
  Administered 2019-10-03: 10 mg via INTRAVENOUS
  Administered 2019-10-03: 20 mg via INTRAVENOUS

## 2019-10-03 MED ORDER — ONDANSETRON HCL 4 MG/2ML IJ SOLN
INTRAMUSCULAR | Status: DC | PRN
  Administered 2019-10-03: 4 mg via INTRAVENOUS

## 2019-10-03 MED ORDER — PROPOFOL 10 MG/ML IV BOLUS
INTRAVENOUS | Status: DC | PRN
  Administered 2019-10-03: 150 mg via INTRAVENOUS

## 2019-10-03 MED ORDER — PANTOPRAZOLE SODIUM 40 MG PO TBEC: 40.0000 mg | DELAYED_RELEASE_TABLET | Freq: Every day | ORAL | 0 refills | Status: DC

## 2019-10-03 MED ORDER — HEPARIN SODIUM (PORCINE) 1000 UNIT/ML IJ SOLN
INTRAMUSCULAR | Status: DC | PRN
  Administered 2019-10-03 (×3): 5000 [IU] via INTRAVENOUS

## 2019-10-03 MED ORDER — HEPARIN SODIUM (PORCINE) 1000 UNIT/ML IJ SOLN
INTRAMUSCULAR | Status: AC
  Filled 2019-10-03: qty 2

## 2019-10-03 MED ORDER — HEPARIN (PORCINE) IN NACL 1000-0.9 UT/500ML-% IV SOLN
INTRAVENOUS | Status: AC
  Filled 2019-10-03: qty 500

## 2019-10-03 MED ORDER — LIDOCAINE HCL (PF) 1 % IJ SOLN: INTRAMUSCULAR | Status: DC | PRN

## 2019-10-03 MED ORDER — MIDAZOLAM HCL 5 MG/5ML IJ SOLN
INTRAMUSCULAR | Status: DC | PRN
  Administered 2019-10-03: 2 mg via INTRAVENOUS

## 2019-10-03 MED ORDER — SODIUM CHLORIDE 0.9 % IV SOLN: 250.0000 mL | INTRAVENOUS | Status: DC | PRN

## 2019-10-03 MED ORDER — HYDROCODONE-ACETAMINOPHEN 5-325 MG PO TABS: 1.0000 | ORAL_TABLET | ORAL | Status: DC | PRN

## 2019-10-03 MED ORDER — BUPIVACAINE HCL (PF) 0.25 % IJ SOLN
INTRAMUSCULAR | Status: AC
  Filled 2019-10-03: qty 30

## 2019-10-03 MED ORDER — ACETAMINOPHEN 325 MG PO TABS: 650.0000 mg | ORAL_TABLET | ORAL | Status: DC | PRN

## 2019-10-03 MED ORDER — HEPARIN SODIUM (PORCINE) 1000 UNIT/ML IJ SOLN
INTRAMUSCULAR | Status: DC | PRN
  Administered 2019-10-03: 14000 [IU] via INTRAVENOUS
  Administered 2019-10-03: 1000 [IU] via INTRAVENOUS

## 2019-10-03 MED ORDER — DEXAMETHASONE SODIUM PHOSPHATE 10 MG/ML IJ SOLN
INTRAMUSCULAR | Status: DC | PRN
  Administered 2019-10-03: 10 mg via INTRAVENOUS

## 2019-10-03 MED ORDER — SODIUM CHLORIDE 0.9% FLUSH: 3.0000 mL | INTRAVENOUS | Status: DC | PRN

## 2019-10-03 MED ORDER — SODIUM CHLORIDE 0.9% FLUSH: 3.0000 mL | Freq: Two times a day (BID) | INTRAVENOUS | Status: DC

## 2019-10-03 MED ORDER — BUPIVACAINE HCL (PF) 0.25 % IJ SOLN
INTRAMUSCULAR | Status: DC | PRN
  Administered 2019-10-03: 30 mL

## 2019-10-03 MED ORDER — RIVAROXABAN 20 MG PO TABS
20.0000 mg | ORAL_TABLET | Freq: Once | ORAL | Status: AC
  Administered 2019-10-03: 20 mg via ORAL
  Filled 2019-10-03: qty 1

## 2019-10-03 SURGICAL SUPPLY — 18 items
BLANKET WARM UNDERBOD FULL ACC (MISCELLANEOUS) ×2 IMPLANT
CATH MAPPNG PENTARAY F 2-6-2MM (CATHETERS) ×1 IMPLANT
CATH SMTCH THERMOCOOL SF DF (CATHETERS) ×2 IMPLANT
CATH SOUNDSTAR ECO 8FR (CATHETERS) ×2 IMPLANT
CATH WEBSTER BI DIR CS D-F CRV (CATHETERS) ×2 IMPLANT
COVER SWIFTLINK CONNECTOR (BAG) ×2 IMPLANT
DEVICE CLOSURE PERCLS PRGLD 6F (VASCULAR PRODUCTS) ×3 IMPLANT
NEEDLE BAYLIS TRANSSEPTAL 71CM (NEEDLE) ×2 IMPLANT
PACK EP LATEX FREE (CUSTOM PROCEDURE TRAY) ×1
PACK EP LF (CUSTOM PROCEDURE TRAY) ×1 IMPLANT
PAD PRO RADIOLUCENT 2001M-C (PAD) ×2 IMPLANT
PATCH CARTO3 (PAD) ×2 IMPLANT
PENTARAY F 2-6-2MM (CATHETERS) ×2
PERCLOSE PROGLIDE 6F (VASCULAR PRODUCTS) ×6
SHEATH PINNACLE 7F 10CM (SHEATH) ×4 IMPLANT
SHEATH PINNACLE 9F 10CM (SHEATH) ×2 IMPLANT
SHEATH SWARTZ TS SL2 63CM 8.5F (SHEATH) ×2 IMPLANT
TUBING SMART ABLATE COOLFLOW (TUBING) ×2 IMPLANT

## 2019-10-03 NOTE — Anesthesia Procedure Notes (Signed)
Procedure Name: Intubation Date/Time: 10/03/2019 1:53 PM Performed by: Jenne Campus, CRNA Pre-anesthesia Checklist: Patient identified, Emergency Drugs available, Suction available and Patient being monitored Patient Re-evaluated:Patient Re-evaluated prior to induction Oxygen Delivery Method: Circle System Utilized Preoxygenation: Pre-oxygenation with 100% oxygen Induction Type: IV induction Ventilation: Mask ventilation without difficulty Laryngoscope Size: Miller and 2 Grade View: Grade I Tube type: Oral Tube size: 7.5 mm Number of attempts: 1 Airway Equipment and Method: Stylet and Bite block Placement Confirmation: ETT inserted through vocal cords under direct vision,  positive ETCO2 and breath sounds checked- equal and bilateral Secured at: 22 cm Tube secured with: Tape Dental Injury: Teeth and Oropharynx as per pre-operative assessment

## 2019-10-03 NOTE — Progress Notes (Signed)
No bleeding or swelling noted after ambulation 

## 2019-10-03 NOTE — Discharge Instructions (Signed)
Post procedure care instructions No driving for 4 days. No lifting over 5 lbs for 1 week. No vigorous or sexual activity for 1 week. You may return to work/usual activities on 10/10/2019. Keep procedure site clean & dry. If you notice increased pain, swelling, bleeding or pus, call/return!  You may shower, but no soaking baths/hot tubs/pools for 1 week.   You have an appointment set up with the [facility].  Multiple studies have shown that being followed by a dedicated atrial fibrillation clinic in addition to the standard care you receive from your other physicians improves health. We believe that enrollment in the atrial fibrillation clinic will allow [place] to better care for you.   The phone number to the [facility] is [phone]. The clinic is staffed Monday through Friday from 8:30am to 5pm.  Parking Directions: The clinic is located in the Heart and Vascular Building connected to [facility]. 1)From [address] turn on to [address] and go to the 3rd entrance  (Heart and Vascular entrance) on the right. 2)Look to the right for Heart &Vascular Parking Garage. 3) the code for the entrance is 7009 for December.   4)Take the elevators to the 1st floor. Registration is in the room with the glass walls at the end of the hallway.  If you have any trouble parking or locating the clinic, please dont hesitate to call [phone].  Cardiac Ablation, Care After This sheet gives you information about how to care for yourself after your procedure. Your health care provider may also give you more specific instructions. If you have problems or questions, contact your health care provider. What can I expect after the procedure? After the procedure, it is common to have:  Bruising around your puncture site.  Tenderness around your puncture site.  Skipped heartbeats.  Tiredness (fatigue).  Follow these instructions at home: Puncture site care   Follow  instructions from your health care provider about how to take care of your puncture site. Make sure you: ? If present, leave stitches (sutures), skin glue, or adhesive strips in place. These skin closures may need to stay in place for up to 2 weeks. If adhesive strip edges start to loosen and curl up, you may trim the loose edges. Do not remove adhesive strips completely unless your health care provider tells you to do that.  Check your puncture site every day for signs of infection. Check for: ? Redness, swelling, or pain. ? Fluid or blood. If your puncture site starts to bleed, lie down on your back, apply firm pressure to the area, and contact your health care provider. ? Warmth. ? Pus or a bad smell. Driving  Do not drive for at least 4 days after your procedure or however long your health care provider recommends.  Do not drive or use heavy machinery while taking prescription pain medicine.  Do not drive for 24 hours if you were given a medicine to help you relax (sedative) during your procedure. Activity  Avoid activities that take a lot of effort for at least 7 days after your procedure.  Do not lift anything that is heavier than 5 lb (4.5 kg) for one week.   No sexual activity for 1 week.   Return to your normal activities as told by your health care provider. Ask your health care provider what activities are safe for you. General instructions  Take over-the-counter and prescription medicines only as told by your health care provider.  Do  not use any products that contain nicotine or tobacco, such as cigarettes and e-cigarettes. If you need help quitting, ask your health care provider.  You may shower after 24 hours, but Do not take baths, swim, or use a hot tub for 1 week.   Do not drink alcohol for 24 hours after your procedure.  Keep all follow-up visits as told by your health care provider. This is important. Contact a health care provider if:  You have redness, mild  swelling, or pain around your puncture site.  You have fluid or blood coming from your puncture site that stops after applying firm pressure to the area.  Your puncture site feels warm to the touch.  You have pus or a bad smell coming from your puncture site.  You have a fever.  You have chest pain or discomfort that spreads to your neck, jaw, or arm.  You are sweating a lot.  You feel nauseous.  You have a fast or irregular heartbeat.  You have shortness of breath.  You are dizzy or light-headed and feel the need to lie down.  You have pain or numbness in the arm or leg closest to your puncture site. Get help right away if:  Your puncture site suddenly swells.  Your puncture site is bleeding and the bleeding does not stop after applying firm pressure to the area. These symptoms may represent a serious problem that is an emergency. Do not wait to see if the symptoms will go away. Get medical help right away. Call your local emergency services (911 in the [place] Do not drive yourself to the hospital. Summary  After the procedure, it is normal to have bruising and tenderness at the puncture site in your groin, neck, or forearm.  Check your puncture site every day for signs of infection.  Get help right away if your puncture site is bleeding and the bleeding does not stop after applying firm pressure to the area. This is a medical emergency. This information is not intended to replace advice given to you by your health care provider. Make sure you discuss any questions you have with your health care provider.

## 2019-10-03 NOTE — Anesthesia Preprocedure Evaluation (Addendum)
Anesthesia Evaluation  Patient identified by MRN, date of birth, ID band Patient awake    Reviewed: Allergy & Precautions, NPO status , Patient's Chart, lab work & pertinent test results  Airway Mallampati: II  TM Distance: >3 FB Neck ROM: Full    Dental no notable dental hx. (+) Teeth Intact, Dental Advisory Given   Pulmonary neg pulmonary ROS,    Pulmonary exam normal breath sounds clear to auscultation       Cardiovascular + dysrhythmias Atrial Fibrillation  Rhythm:Irregular Rate:Normal     Neuro/Psych negative neurological ROS  negative psych ROS   GI/Hepatic negative GI ROS, Neg liver ROS,   Endo/Other  negative endocrine ROS  Renal/GU negative Renal ROS  negative genitourinary   Musculoskeletal negative musculoskeletal ROS (+)   Abdominal   Peds negative pediatric ROS (+)  Hematology negative hematology ROS (+)   Anesthesia Other Findings   Reproductive/Obstetrics negative OB ROS                            Anesthesia Physical Anesthesia Plan  ASA: II  Anesthesia Plan: General   Post-op Pain Management:    Induction: Intravenous  PONV Risk Score and Plan: 2 and Ondansetron, Dexamethasone and Treatment may vary due to age or medical condition  Airway Management Planned: Oral ETT  Additional Equipment:   Intra-op Plan:   Post-operative Plan: Extubation in OR  Informed Consent: I have reviewed the patients History and Physical, chart, labs and discussed the procedure including the risks, benefits and alternatives for the proposed anesthesia with the patient or authorized representative who has indicated his/her understanding and acceptance.     Dental advisory given  Plan Discussed with: CRNA and Surgeon  Anesthesia Plan Comments:         Anesthesia Quick Evaluation

## 2019-10-03 NOTE — Anesthesia Postprocedure Evaluation (Signed)
Anesthesia Post Note  Patient: Glorious Peach, MD  Procedure(s) Performed: ATRIAL FIBRILLATION ABLATION (N/A )     Patient location during evaluation: PACU Anesthesia Type: General Level of consciousness: awake and alert Pain management: pain level controlled Vital Signs Assessment: post-procedure vital signs reviewed and stable Respiratory status: spontaneous breathing, nonlabored ventilation, respiratory function stable and patient connected to nasal cannula oxygen Cardiovascular status: blood pressure returned to baseline and stable Postop Assessment: no apparent nausea or vomiting Anesthetic complications: no    Last Vitals:  Vitals:   10/03/19 1735 10/03/19 1801  BP: 113/65 111/68  Pulse: 72 74  Resp: 16 15  Temp:    SpO2: 97% 97%    Last Pain:  Vitals:   10/03/19 1801  TempSrc:   PainSc: 0-No pain                 Thos Matsumoto S

## 2019-10-03 NOTE — Interval H&P Note (Signed)
History and Physical Interval Note:  10/03/2019 1:23 PM  Ethan Peach, MD  has presented today for surgery, with the diagnosis of atrial fibrillation.  The various methods of treatment have been discussed with the patient and family. After consideration of risks, benefits and other options for treatment, the patient has consented to  Procedure(s): ATRIAL FIBRILLATION ABLATION (N/A) as a surgical intervention.  The patient's history has been reviewed, patient examined, no change in status, stable for surgery.  I have reviewed the patient's chart and labs.  Questions were answered to the patient's satisfaction.     Cardiac CT reviewed with the patient today.  He reports compliance with his xarelto without interruption.  Thompson Grayer

## 2019-10-04 ENCOUNTER — Encounter (HOSPITAL_COMMUNITY): Payer: Self-pay | Admitting: Internal Medicine

## 2019-10-04 NOTE — Transfer of Care (Signed)
Immediate Anesthesia Transfer of Care Note  Patient: Glorious Peach, MD  Procedure(s) Performed: ATRIAL FIBRILLATION ABLATION (N/A )  Patient Location: PACU  Anesthesia Type:General  Level of Consciousness: patient cooperative  Airway & Oxygen Therapy: Patient Spontanous Breathing and Patient connected to nasal cannula oxygen  Post-op Assessment: Report given to RN and Post -op Vital signs reviewed and stable  Post vital signs: Reviewed and stable  Last Vitals:  Vitals Value Taken Time  BP 117/68 10/03/19 1945  Temp 36.6 C 10/03/19 1730  Pulse 77 10/03/19 2010  Resp 15 10/03/19 1801  SpO2 98 % 10/03/19 2010  Vitals shown include unvalidated device data.  Last Pain:  Vitals:   10/03/19 1945  TempSrc:   PainSc: 0-No pain         Complications: No apparent anesthesia complications

## 2019-11-01 ENCOUNTER — Ambulatory Visit (HOSPITAL_COMMUNITY): Payer: 59 | Admitting: Nurse Practitioner

## 2019-11-06 ENCOUNTER — Encounter (HOSPITAL_COMMUNITY): Payer: Self-pay | Admitting: Nurse Practitioner

## 2019-11-06 ENCOUNTER — Ambulatory Visit (HOSPITAL_COMMUNITY)
Admission: RE | Admit: 2019-11-06 | Discharge: 2019-11-06 | Disposition: A | Discharge status: Home / Self Care | Payer: 59 | Source: Ambulatory Visit | Attending: Nurse Practitioner | Admitting: Nurse Practitioner

## 2019-11-06 ENCOUNTER — Other Ambulatory Visit: Payer: Self-pay

## 2019-11-06 VITALS — BP 124/68 | HR 62 | Ht 72.0 in | Wt 183.2 lb

## 2019-11-06 DIAGNOSIS — Z85828 Personal history of other malignant neoplasm of skin: Secondary | ICD-10-CM | POA: Insufficient documentation

## 2019-11-06 DIAGNOSIS — Z8546 Personal history of malignant neoplasm of prostate: Secondary | ICD-10-CM | POA: Insufficient documentation

## 2019-11-06 DIAGNOSIS — I480 Paroxysmal atrial fibrillation: Secondary | ICD-10-CM | POA: Insufficient documentation

## 2019-11-06 DIAGNOSIS — I4819 Other persistent atrial fibrillation: Secondary | ICD-10-CM | POA: Diagnosis not present

## 2019-11-06 DIAGNOSIS — Z79899 Other long term (current) drug therapy: Secondary | ICD-10-CM | POA: Insufficient documentation

## 2019-11-06 DIAGNOSIS — D6869 Other thrombophilia: Secondary | ICD-10-CM | POA: Diagnosis not present

## 2019-11-06 DIAGNOSIS — Z7901 Long term (current) use of anticoagulants: Secondary | ICD-10-CM | POA: Insufficient documentation

## 2019-11-06 NOTE — Progress Notes (Signed)
Primary Care Physician: Gaynelle Arabian, MD Referring Physician: Dr. Durenda Hurt, MD is a 58 y.o. male with a h/o afib that is in the afib clinic for  ablation one month ago. Prior ablation in 2014. EKG shows NSR. No awareness of afib.No swallowing/indigestion or groin issues. He would like to stop Protonix as he has had no esophageal issues. He feels well.   Today, he denies symptoms of palpitations, chest pain, shortness of breath, orthopnea, PND,+ mild  lower extremity edema, no dizziness, presyncope, syncope, or neurologic sequela. The patient is tolerating medications without difficulties and is otherwise without complaint today.   Past Medical History:  Diagnosis Date  . Basal cell cancer   . Cancer [facility]) 03/2007   prostate Cancer s/p prostatectomy  . Erectile dysfunction   . Paroxysmal atrial fibrillation ([place])    s/p [facility] at [facility] in [facility]  . Seasonal allergies    Past Surgical History:  Procedure Laterality Date  . ATRIAL FIBRILLATION ABLATION  2014   [facility]  . ATRIAL FIBRILLATION ABLATION N/A 10/03/2019   Procedure: ATRIAL FIBRILLATION ABLATION;  Surgeon: Thompson Grayer, MD;  Location: [place] [facility];  Service: Cardiovascular;  Laterality: N/A;  . CARDIOVERSION  01/15/2012   Procedure: CARDIOVERSION;  Surgeon: Sueanne Margarita, MD;  Location: [place] OR;  Service: Cardiovascular;  Laterality: N/A;  . JOINT REPLACEMENT  10/2006   left knee replacement  . LAMINECTOMY  1982   L2/L3  . PROSTATECTOMY      Current Outpatient Medications  Medication Sig Dispense Refill  . diltiazem (CARTIA XT) 240 MG 24 hr capsule Take 1 capsule (240 mg total) by mouth 2 (two) times daily. 180 capsule 0  . doxycycline (VIBRAMYCIN) 100 MG capsule Take 100 mg by mouth 2 (two) times daily.    . flecainide (TAMBOCOR) 100 MG tablet Take 1 tablet (100 mg total) by mouth 2 (two) times daily. 180 tablet 3  . MELATONIN PO Take 1 capsule by mouth at bedtime as needed (sleep).    . Multiple Vitamin  (MULTIVITAMIN) capsule Take 1 capsule by mouth daily.    . Omega-3 Fatty Acids (FISH OIL PO) Take 1 tablet by mouth 3 (three) times a week.     . pantoprazole (PROTONIX) 40 MG tablet Take 1 tablet (40 mg total) by mouth daily. 45 tablet 0  . rivaroxaban (XARELTO) 20 MG TABS tablet Take 1 tablet (20 mg total) by mouth daily with supper. (Patient taking differently: Take 20 mg by mouth daily. ) 30 tablet 3   No current facility-administered medications for this encounter.    No Known Allergies  Social History   Socioeconomic History  . Marital status: Married    Spouse name: Not on file  . Number of children: Not on file  . Years of education: Not on file  . Highest education level: Not on file  Occupational History  . Not on file  Tobacco Use  . Smoking status: Never Smoker  . Smokeless tobacco: Never Used  Substance and Sexual Activity  . Alcohol use: Yes    Alcohol/week: 2.0 - 3.0 standard drinks    Types: 2 - 3 Cans of beer per week  . Drug use: Never  . Sexual activity: Not on file  Other Topics Concern  . 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 Difficulty of Paying Living Expenses:  Not on file  Food Insecurity:   . Worried About Charity fundraiser in the Last Year: Not on file  . Ran Out of Food in the Last Year: Not on file  Transportation Needs:   . Lack of Transportation (Medical): Not on file  . Lack of Transportation (Non-Medical): Not on file  Physical Activity:   . Days of Exercise per Week: Not on file  . Minutes of Exercise per Session: Not on file  Stress:   . Feeling of Stress : Not on file  Social Connections:   . Frequency of Communication with Friends and Family: Not on file  . Frequency of Social Gatherings with Friends and Family: Not on file  . Attends Religious Services: Not on file  . Active Member of Clubs or Organizations: Not on file  .  Attends Archivist Meetings: Not on file  . Marital Status: Not on file  Intimate Partner Violence:   . Fear of Current or Ex-Partner: Not on file  . Emotionally Abused: Not on file  . Physically Abused: Not on file  . Sexually Abused: Not on file    Family History  Problem Relation Age of Onset  . Cancer Paternal Grandfather        prostate CA    ROS- All systems are reviewed and negative except as per the HPI above  Physical Exam: There were no vitals filed for this visit. Wt Readings from Last 3 Encounters:  10/03/19 79.4 kg  09/11/19 81 kg  11/25/18 80.4 kg    Labs: Lab Results  Component Value Date   NA 135 09/25/2019   K 4.1 09/25/2019   CL 99 09/25/2019   CO2 26 09/25/2019   GLUCOSE 121 (H) 09/25/2019   BUN 14 09/25/2019   CREATININE 0.94 09/25/2019   CALCIUM 9.0 09/25/2019   No results found for: INR No results found for: CHOL, HDL, LDLCALC, TRIG   GEN- The patient is well appearing, alert and oriented x 3 today.   Head- normocephalic, atraumatic Eyes-  Sclera clear, conjunctiva pink Ears- hearing intact Oropharynx- clear Neck- supple, no JVP Lymph- no cervical lymphadenopathy Lungs- Clear to ausculation bilaterally, normal work of breathing Heart- Regular rate and rhythm, no murmurs, rubs or gallops, PMI not laterally displaced GI- soft, NT, ND, + BS Extremities- no clubbing, cyanosis, or edema MS- no significant deformity or atrophy Skin- no rash or lesion Psych- euthymic mood, full affect Neuro- strength and sensation are intact  EKG-NSR, normal ekg. Epic records reviewed Echo-08/2018-Study Conclusions  - Left ventricle: The cavity size was mildly dilated. Wall   thickness was normal. Systolic function was normal. The estimated   ejection fraction was in the range of 55% to 60%. Wall motion was   normal; there were no regional wall motion abnormalities. - Mitral valve: There was mild regurgitation. - Left atrium: The atrium was  severely dilated.  Impressions:  - Normal LV function; mild MR; severe LAE.   Assessment and Plan: 1. Afib  Doing well s/p ablation one month ago.  No awareness of afib Continue flecainide at 100 mg bid  Continue diltiazem 240 mg qd   2. CHA2DS2VASc score of 0 Continue  xarelto 20 mg daily, anticipate anticoagulation being stopped at the 3 month check up when seen by Dr. Rayann Heman   He would like to stop Protonix as he has had no GI  issues. If symptoms do re occur, he will restart.  F/u with Dr. Rayann Heman 01/01/20  Geroge Baseman Viviane Semidey, [facility] [address] [place],  [address] [phone]

## 2019-11-06 NOTE — Addendum Note (Signed)
Encounter addended by: Enid Derry, CMA on: 11/06/2019 12:07 PM  Actions taken: Order list changed

## 2019-11-13 ENCOUNTER — Ambulatory Visit: Payer: 59 | Admitting: Internal Medicine

## 2019-11-23 ENCOUNTER — Other Ambulatory Visit: Payer: Self-pay | Admitting: Internal Medicine

## 2019-12-13 ENCOUNTER — Other Ambulatory Visit: Payer: Self-pay | Admitting: Internal Medicine

## 2020-01-01 ENCOUNTER — Telehealth: Payer: Self-pay

## 2020-01-01 ENCOUNTER — Telehealth (INDEPENDENT_AMBULATORY_CARE_PROVIDER_SITE_OTHER): Payer: 59 | Admitting: Internal Medicine

## 2020-01-01 ENCOUNTER — Encounter: Payer: Self-pay | Admitting: Internal Medicine

## 2020-01-01 VITALS — Ht 72.0 in | Wt 175.0 lb

## 2020-01-01 DIAGNOSIS — I480 Paroxysmal atrial fibrillation: Secondary | ICD-10-CM | POA: Diagnosis not present

## 2020-01-01 MED ORDER — DILTIAZEM HCL ER COATED BEADS 240 MG PO CP24: 240.0000 mg | ORAL_CAPSULE | Freq: Every day | ORAL | 3 refills | Status: DC

## 2020-01-01 MED ORDER — FLECAINIDE ACETATE 100 MG PO TABS: 100.0000 mg | ORAL_TABLET | Freq: Two times a day (BID) | ORAL | 0 refills | Status: DC

## 2020-01-01 NOTE — Telephone Encounter (Signed)
-----   Message from Thompson Grayer, MD sent at 01/01/2020 10:28 AM EST ----- Reduce diltiazem CD to 240mg  daily Stop xarelto today  In 2 weeks he will stop flecainide  Follow-up with me in 3 months

## 2020-01-01 NOTE — Progress Notes (Signed)
Electrophysiology TeleHealth Note   Due to national recommendations of social distancing due to [place] 19, an audio telehealth visit is felt to be most appropriate for this patient at this time.  See MyChart message from today for the patient's consent to telehealth for [facility].  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  Since his ablation, the patient reports doing very well. Pleased with results.  Denies procedure related complications.  Today, he denies symptoms of palpitations, chest pain, shortness of breath,  lower extremity edema, dizziness, presyncope, or syncope.  The patient is otherwise without complaint today.  The patient denies symptoms of fevers, chills, cough, or new SOB worrisome for COVID 19.  Past Medical History:  Diagnosis Date  . Basal cell cancer   . Cancer [facility]) 03/2007   prostate Cancer s/p prostatectomy  . Erectile dysfunction   . Paroxysmal atrial fibrillation ([place])    s/p PVI at [facility] in 2014  . Seasonal allergies     Past Surgical History:  Procedure Laterality Date  . ATRIAL FIBRILLATION ABLATION  2014   [facility]  . ATRIAL FIBRILLATION ABLATION N/A 10/03/2019   Procedure: ATRIAL FIBRILLATION ABLATION;  Surgeon: Thompson Grayer, MD;  Location: [place] [facility];  Service: Cardiovascular;  Laterality: N/A;  . CARDIOVERSION  01/15/2012   Procedure: CARDIOVERSION;  Surgeon: Sueanne Margarita, MD;  Location: [place] OR;  Service: Cardiovascular;  Laterality: N/A;  . JOINT REPLACEMENT  10/2006   left knee replacement  . LAMINECTOMY  1982   L2/L3  . PROSTATECTOMY      Current Outpatient Medications  Medication Sig  Dispense Refill  . CARTIA XT 240 MG 24 hr capsule TAKE ONE CAPSULE BY MOUTH TWICE A DAY 30 capsule 5  . flecainide (TAMBOCOR) 100 MG tablet TAKE ONE TABLET BY MOUTH TWICE A DAY 60 tablet 6  . MELATONIN PO Take 1 capsule by mouth at bedtime as needed (sleep).    . Multiple Vitamin (MULTIVITAMIN) capsule Take 1 capsule by mouth daily.    . Omega-3 Fatty Acids (FISH OIL PO) Take 1 tablet by mouth 3 (three) times a week.     . rivaroxaban (XARELTO) 20 MG TABS tablet Take 1 tablet (20 mg total) by mouth daily with supper. 30 tablet 3   No current facility-administered medications for this visit.    Allergies:   Patient has no known allergies.   Social History:  The patient  reports that he has never smoked. He has never used smokeless tobacco. He reports current alcohol use of about 2.0 - 3.0 standard drinks of alcohol per week. He reports that he does not use drugs.   Family History:  The patient's family history includes Cancer in his paternal grandfather.   ROS:  Please see the history of present illness.   All other systems are personally reviewed and negative.    Exam:    Vital Signs:  Ht 6' (1.829 m)   Wt 175 lb (79.4 kg)   BMI 23.73 kg/m   Well sounding  Labs/Other Tests and Data Reviewed:    Recent Labs: 09/25/2019: BUN 14; Creatinine, Ser 0.94; Hemoglobin  13.1; Platelets 492; Potassium 4.1; Sodium 135   Wt Readings from Last 3 Encounters:  01/01/20 175 lb (79.4 kg)  11/06/19 183 lb 3.2 oz (83.1 kg)  10/03/19 175 lb (79.4 kg)     ASSESSMENT & PLAN:    1.  Paroxysmal atrial fibrillation Doing great post ablation Reduce diltiazem DC to 240mg  daily In 2 weeks stop flecainide Stop xarelto today as his chads2vasc score is 0. If doing well, he may stop his diltiazem completely in 6-8 weeks, prior to his next visit  Follow-up:  3 months with me   Patient Risk:  after full review of this patients clinical status, I feel that they are at moderate risk at this  time.  Today, I have spent 15 minutes with the patient with telehealth technology discussing arrhythmia management .    Army Fossa, MD  01/01/2020 10:20 AM     [facility] [address] [place]  [address] [phone] (office) [phone] (fax)

## 2020-03-20 ENCOUNTER — Other Ambulatory Visit: Payer: Self-pay

## 2020-03-20 ENCOUNTER — Telehealth (INDEPENDENT_AMBULATORY_CARE_PROVIDER_SITE_OTHER): Payer: 59 | Admitting: Internal Medicine

## 2020-03-20 DIAGNOSIS — I480 Paroxysmal atrial fibrillation: Secondary | ICD-10-CM

## 2020-03-20 NOTE — Progress Notes (Signed)
Visit cancelled I left VM for patient but unable to reach him.  We will attempt to reschedule.

## 2020-04-12 ENCOUNTER — Telehealth: Payer: Self-pay

## 2020-04-12 NOTE — Telephone Encounter (Signed)
Left a message regarding virtual appt on 04/15/20.

## 2020-04-15 ENCOUNTER — Telehealth (INDEPENDENT_AMBULATORY_CARE_PROVIDER_SITE_OTHER): Payer: 59 | Admitting: Internal Medicine

## 2020-04-15 ENCOUNTER — Other Ambulatory Visit: Payer: Self-pay

## 2020-04-15 VITALS — BP 135/76

## 2020-04-15 DIAGNOSIS — I480 Paroxysmal atrial fibrillation: Secondary | ICD-10-CM | POA: Diagnosis not present

## 2020-04-15 NOTE — Progress Notes (Signed)
Electrophysiology TeleHealth Note   Due to national recommendations of social distancing due to [place], an audio telehealth visit is felt to be most appropriate for this patient at this time.  See MyChart message from today for the patient's consent to telehealth for [facility].  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  Since last being seen in our clinic, the patient reports doing very well.  No afib or palpitations.  Today, he denies symptoms of palpitations, chest pain, shortness of breath,  lower extremity edema, dizziness, presyncope, or syncope.  The patient is otherwise without complaint today.   Past Medical History:  Diagnosis Date  . Basal cell cancer   . Cancer [facility]) 03/2007   prostate Cancer s/p prostatectomy  . Erectile dysfunction   . Paroxysmal atrial fibrillation ([place])    [facility]  . Seasonal allergies     Past Surgical History:  Procedure Laterality Date  . ATRIAL FIBRILLATION ABLATION  2014   [facility]  . ATRIAL FIBRILLATION ABLATION N/A 10/03/2019   Procedure: ATRIAL FIBRILLATION ABLATION;  Surgeon: Thompson Grayer, MD;  Location: [place] [facility];  Service: Cardiovascular;  Laterality: N/A;  . CARDIOVERSION  01/15/2012   Procedure: CARDIOVERSION;  Surgeon: Sueanne Margarita, MD;  Location: [place] OR;  Service: Cardiovascular;  Laterality: N/A;  . JOINT REPLACEMENT  10/2006   left knee replacement  . LAMINECTOMY  1982   L2/L3  . PROSTATECTOMY      Current Outpatient Medications  Medication Sig Dispense Refill  . MELATONIN PO Take 1 capsule by mouth at bedtime as needed (sleep).    . Multiple Vitamin  (MULTIVITAMIN) capsule Take 1 capsule by mouth daily.    . Omega-3 Fatty Acids (FISH OIL PO) Take 1 tablet by mouth 3 (three) times a week.      No current facility-administered medications for this visit.    Allergies:   Patient has no known allergies.   Social History:  The patient  reports that he has never smoked. He has never used smokeless tobacco. He reports current alcohol use of about 2.0 - 3.0 standard drinks of alcohol per week. He reports that he does not use drugs.   ROS:  Please see the history of present illness.   All other systems are personally reviewed and negative.    Exam:    Vital Signs:  BP 135/76   Well sounding and appearing   Labs/Other Tests and Data Reviewed:    Recent Labs: 09/25/2019: BUN 14; Creatinine, Ser 0.94; Hemoglobin 13.1; Platelets 492; Potassium 4.1; Sodium 135   Wt Readings from Last 3 Encounters:  01/01/20 175 lb (79.4 kg)  11/06/19 183 lb 3.2 oz (83.1 kg)  10/03/19 175 lb (79.4 kg)      ASSESSMENT & PLAN:    1.  Paroxysmal atrial fibrillation Doing great post ablation, without recurrence off AAD therapy chads2vasc score is 0.  He does not require Energy therapy   Risks, benefits and potential toxicities for medications prescribed and/or refilled reviewed with patient today.   Follow-up:  12 months with me   Patient Risk:  after full review of this patients clinical status, I feel that they are at moderate risk at this time.  Today, I have spent 15 minutes with the patient with telehealth technology discussing arrhythmia management .    SignedThompson Grayer, MD  04/15/2020 2:55 PM     [facility] [address] [place] [address] [phone] (office) [phone] (fax)

## 2021-10-28 DIAGNOSIS — R590 Localized enlarged lymph nodes: Secondary | ICD-10-CM | POA: Diagnosis not present

## 2021-10-28 DIAGNOSIS — Z13220 Encounter for screening for lipoid disorders: Secondary | ICD-10-CM | POA: Diagnosis not present

## 2021-10-28 DIAGNOSIS — Z0000 Encounter for general adult medical examination without abnormal findings: Secondary | ICD-10-CM | POA: Diagnosis not present

## 2021-10-28 DIAGNOSIS — D649 Anemia, unspecified: Secondary | ICD-10-CM | POA: Diagnosis not present

## 2021-10-28 DIAGNOSIS — Z8546 Personal history of malignant neoplasm of prostate: Secondary | ICD-10-CM | POA: Diagnosis not present

## 2021-11-13 DIAGNOSIS — D509 Iron deficiency anemia, unspecified: Secondary | ICD-10-CM | POA: Diagnosis not present

## 2021-11-19 ENCOUNTER — Other Ambulatory Visit: Payer: Self-pay | Admitting: *Deleted

## 2021-11-19 DIAGNOSIS — D509 Iron deficiency anemia, unspecified: Secondary | ICD-10-CM

## 2021-11-19 NOTE — Progress Notes (Signed)
New patient appt scheduled for 12/03/21 at 1020 with Dr [name] with labs same day. Lab orders entered

## 2021-11-24 ENCOUNTER — Other Ambulatory Visit: Payer: Self-pay

## 2021-11-24 ENCOUNTER — Inpatient Hospital Stay: Payer: BC Managed Care – PPO | Attending: Oncology

## 2021-11-24 ENCOUNTER — Inpatient Hospital Stay: Payer: BC Managed Care – PPO | Admitting: Oncology

## 2021-11-24 VITALS — BP 134/59 | HR 67 | Temp 97.8°F | Resp 20 | Ht 72.0 in | Wt 173.0 lb

## 2021-11-24 DIAGNOSIS — Z8546 Personal history of malignant neoplasm of prostate: Secondary | ICD-10-CM | POA: Diagnosis not present

## 2021-11-24 DIAGNOSIS — D7589 Other specified diseases of blood and blood-forming organs: Secondary | ICD-10-CM | POA: Diagnosis not present

## 2021-11-24 DIAGNOSIS — D539 Nutritional anemia, unspecified: Secondary | ICD-10-CM | POA: Insufficient documentation

## 2021-11-24 DIAGNOSIS — D509 Iron deficiency anemia, unspecified: Secondary | ICD-10-CM

## 2021-11-24 DIAGNOSIS — D649 Anemia, unspecified: Secondary | ICD-10-CM | POA: Diagnosis not present

## 2021-11-24 DIAGNOSIS — R5381 Other malaise: Secondary | ICD-10-CM | POA: Diagnosis not present

## 2021-11-24 DIAGNOSIS — D75839 Thrombocytosis, unspecified: Secondary | ICD-10-CM | POA: Diagnosis not present

## 2021-11-24 DIAGNOSIS — R599 Enlarged lymph nodes, unspecified: Secondary | ICD-10-CM | POA: Diagnosis not present

## 2021-11-24 DIAGNOSIS — R591 Generalized enlarged lymph nodes: Secondary | ICD-10-CM | POA: Diagnosis not present

## 2021-11-24 LAB — RETICULOCYTES
Immature Retic Fract: 9.8 % (ref 2.3–15.9)
RBC.: 3.6 MIL/uL — ABNORMAL LOW (ref 4.22–5.81)
Retic Count, Absolute: 88.9 10*3/uL (ref 19.0–186.0)
Retic Ct Pct: 2.5 % (ref 0.4–3.1)

## 2021-11-24 LAB — CMP (CANCER CENTER ONLY)
ALT: 10 U/L (ref 0–44)
AST: 15 U/L (ref 15–41)
Albumin: 4.2 g/dL (ref 3.5–5.0)
Alkaline Phosphatase: 71 U/L (ref 38–126)
Anion gap: 5 (ref 5–15)
BUN: 15 mg/dL (ref 8–23)
CO2: 29 mmol/L (ref 22–32)
Calcium: 9.2 mg/dL (ref 8.9–10.3)
Chloride: 103 mmol/L (ref 98–111)
Creatinine: 0.85 mg/dL (ref 0.61–1.24)
GFR, Estimated: >60 mL/min (ref >60)
Glucose, Bld: 95 mg/dL (ref 70–99)
Potassium: 5.1 mmol/L (ref 3.5–5.1)
Sodium: 137 mmol/L (ref 135–145)
Total Bilirubin: 0.9 mg/dL (ref 0.3–1.2)
Total Protein: 7.8 g/dL (ref 6.5–8.1)

## 2021-11-24 LAB — CBC WITH DIFFERENTIAL (CANCER CENTER ONLY)
Abs Immature Granulocytes: 0.02 10*3/uL (ref 0.00–0.07)
Basophils Absolute: 0 10*3/uL (ref 0.0–0.1)
Basophils Relative: 1 %
Eosinophils Absolute: 0.1 10*3/uL (ref 0.0–0.5)
Eosinophils Relative: 1 %
HCT: 36.1 % — ABNORMAL LOW (ref 39.0–52.0)
Hemoglobin: 11.3 g/dL — ABNORMAL LOW (ref 13.0–17.0)
Immature Granulocytes: 0 %
Lymphocytes Relative: 23 %
Lymphs Abs: 1.4 10*3/uL (ref 0.7–4.0)
MCH: 31.4 pg (ref 26.0–34.0)
MCHC: 31.3 g/dL (ref 30.0–36.0)
MCV: 100.3 fL — ABNORMAL HIGH (ref 80.0–100.0)
Monocytes Absolute: 1 10*3/uL (ref 0.1–1.0)
Monocytes Relative: 17 %
Neutro Abs: 3.5 10*3/uL (ref 1.7–7.7)
Neutrophils Relative %: 58 %
Platelet Count: 406 10*3/uL — ABNORMAL HIGH (ref 150–400)
RBC: 3.6 MIL/uL — ABNORMAL LOW (ref 4.22–5.81)
RDW: 14.6 % (ref 11.5–15.5)
WBC Count: 5.9 10*3/uL (ref 4.0–10.5)
nRBC: 0 % (ref 0.0–0.2)

## 2021-11-24 LAB — VITAMIN B12: Vitamin B-12: 246 pg/mL (ref 180–914)

## 2021-11-24 LAB — LACTATE DEHYDROGENASE: LDH: 174 U/L (ref 98–192)

## 2021-11-24 LAB — SAVE SMEAR(SSMR), FOR PROVIDER SLIDE REVIEW

## 2021-11-24 LAB — TSH: TSH: 4.149 u[IU]/mL (ref 0.350–4.500)

## 2021-11-24 NOTE — Progress Notes (Signed)
[facility] Patient Consult   Requesting MD: Gaynelle Arabian, Md 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  [birth year]    Reason for Consult: Anemia   HPI: Dr. [name] saw Dr. Marisue Humble for a routine visit on 10/28/2021.  A CBC found the hemoglobin at 11.5, WBC 5.2, and platelets 484,000.  The neutrophil count returned at 3.4 with an absolute lymphocyte count of 0.8.  Additional labs included a creatinine of 0.92, calcium 9.2, total protein 7.9, albumin 4.3, and total bilirubin 0.8.  The PSA returned at less than 0.01.  Dr. [name] has noted decreased recovery following exercise for the past year.  His pulse takes longer to return to baseline following exercise.  He has mild dyspnea on exertion.  He reports sweats at night once or twice per week for the past few months.  He has noted lymph nodes in the right greater than left groin for the past few months.  He has no history of anemia.  No bleeding.  A CBC 09/25/2019, hemoglobin 13.1, MCV 93.  The platelet count returned at 492,000.  On 08/08/2018 the hemoglobin was 13 with an MCV of 93.    Past Medical History:  Diagnosis Date   Basal cell cancer    Cancer ([place]) 03/2007   prostate Cancer s/p prostatectomy   Erectile dysfunction    Paroxysmal atrial fibrillation ([place])    s/p [facility] at [facility] in [facility], ablation 10/03/2019 at [facility]   Seasonal allergies     .  Squamous cell cancer left neck   .  Prostate cancer-prostatectomy 2007 Past Surgical History:  Procedure Laterality Date   ATRIAL FIBRILLATION ABLATION  2014   [facility]   ATRIAL FIBRILLATION ABLATION N/A 10/03/2019   Procedure: ATRIAL FIBRILLATION ABLATION;  Surgeon: Thompson Grayer, MD;  Location: [place] [facility];  Service: Cardiovascular;  Laterality: N/A;   CARDIOVERSION  01/15/2012   Procedure: CARDIOVERSION;  Surgeon: Sueanne Margarita, MD;  Location: [facility];  Service: Cardiovascular;  Laterality: N/A;   JOINT REPLACEMENT   10/2006   left knee replacement   LAMINECTOMY  1982   L2/L3   PROSTATECTOMY      Medications: Reviewed  Allergies: No Known Allergies  Family history: His mother had cervical cancer  Social History:   He is a retired Pharmacist, community.  He lives in [place].  He does not use cigarettes.  He has approximately 5 alcohol drinks per week.  No transfusion history.  No risk factor for HIV or hepatitis.  ROS:   Positives include: Night sweats once or twice per week for a few months, 3 to 4 pound weight loss over the past 6 months, lymph nodes in the right greater than left inguinal area, discomfort at the mid upper abdomen when stretching the back, occasional numbness in the fingertips, upper respiratory infection in early December with a mild cough-improved, decreased exercise tolerance  A complete ROS was otherwise negative.  Physical Exam:  Blood pressure (!) 134/59, pulse 67, temperature 97.8 F (36.6 C), temperature source Oral, resp. rate 20, height 6' (1.829 m), weight 173 lb (78.5 kg), SpO2 100 %.  HEENT: Oropharynx without visible mass, neck without mass Lungs: Clear bilaterally Cardiac: Regular rate and rhythm Abdomen: No hepatosplenomegaly, no mass, nontender GU: Testes without mass Vascular: No leg edema Lymph nodes: No cervical or supraclavicular nodes.  "Shotty "and 1 cm bilateral axillary and inguinal nodes Neurologic: Alert and oriented Skin: No rash  Musculoskeletal: No spine tenderness   LAB:  CBC  Lab Results  Component Value Date   WBC 5.9 11/24/2021   HGB 11.3 (L) 11/24/2021   HCT 36.1 (L) 11/24/2021   MCV 100.3 (H) 11/24/2021   PLT 406 (H) 11/24/2021   NEUTROABS PENDING 11/24/2021    Blood smear: The platelets appear increased in number, no platelet clumps.  Few plasmacytoid lymphocytes.  No blasts or other young forms are seen.  The majority the white cells are mature neutrophils and lymphocytes.  Few ovalocytes.  Rare teardrop.  The polychromasia is not  increased.  No nucleated red cells.    CMP  Lab Results  Component Value Date   NA 135 09/25/2019   K 4.1 09/25/2019   CL 99 09/25/2019   CO2 26 09/25/2019   GLUCOSE 121 (H) 09/25/2019   BUN 14 09/25/2019   CREATININE 0.94 09/25/2019   CALCIUM 9.0 09/25/2019   GFRNONAA 88 09/25/2019   GFRAA 101 09/25/2019       Assessment/Plan:   Macrocytic anemia  Upper normal platelet count  3.   Small palpable lymph nodes  4.   Remote history of prostate cancer   Disposition:   Dr. [name] is referred for evaluation of macrocytic anemia.  He has developed Red cell macrocytosis and mild anemia since the comparison CBC from November 2020.  There is no obvious explanation for the anemia upon review of his history, physical examination, and blood smear today.  We reviewed the differential diagnosis including myelodysplasia, a myeloproliferative disorder, lymphoma, and myeloma.  We also discussed the possibility of macrocytosis from alcohol use, vitamin B12 deficiency, and hypothyroidism.  I doubt the anemia is related to the history of prostate cancer.  We obtained additional diagnostic evaluation today including an LDH, vitamin B12, TSH, reticulocyte count, and myeloma panel.  His symptom complex and small palpable lymph nodes suggest the possibility of a lymphoproliferative disorder.  He will be referred for CTs of the chest, abdomen, and pelvis if the above laboratory evaluation is nondiagnostic.  Dr. [name] will return for an office visit on 12/04/2021.  [name], MD  11/24/2021, 1:53 PM

## 2021-11-25 LAB — KAPPA/LAMBDA LIGHT CHAINS
Kappa free light chain: 146.1 mg/L — ABNORMAL HIGH (ref 3.3–19.4)
Kappa, lambda light chain ratio: 22.48 — ABNORMAL HIGH (ref 0.26–1.65)
Lambda free light chains: 6.5 mg/L (ref 5.7–26.3)

## 2021-11-27 ENCOUNTER — Other Ambulatory Visit: Payer: Self-pay | Admitting: Oncology

## 2021-11-27 ENCOUNTER — Encounter: Payer: Self-pay | Admitting: Oncology

## 2021-11-27 ENCOUNTER — Encounter: Payer: Self-pay | Admitting: *Deleted

## 2021-11-27 ENCOUNTER — Other Ambulatory Visit: Payer: Self-pay | Admitting: *Deleted

## 2021-11-27 DIAGNOSIS — D649 Anemia, unspecified: Secondary | ICD-10-CM

## 2021-11-27 NOTE — Progress Notes (Signed)
Called and spoke with patient regarding anticipating results and that Dr [name] would like to go ahead and schedule Bone Marrow biopsy and aspiration as part of continued work up. Bone Marrow biopsy and aspiration scheduled with radiology for 12/05/21 at 0900. Patient aware.  Have rescheduled appt with Dr [name] for 12/08/21 at 3:30

## 2021-11-27 NOTE — Progress Notes (Signed)
Per Dr [name] orders entered for Bone marrow biopsy and aspiration

## 2021-11-28 LAB — MULTIPLE MYELOMA PANEL, SERUM
Albumin SerPl Elph-Mcnc: 3.8 g/dL (ref 2.9–4.4)
Albumin/Glob SerPl: 1 (ref 0.7–1.7)
Alpha 1: 0.3 g/dL (ref 0.0–0.4)
Alpha2 Glob SerPl Elph-Mcnc: 0.7 g/dL (ref 0.4–1.0)
B-Globulin SerPl Elph-Mcnc: 0.8 g/dL (ref 0.7–1.3)
Gamma Glob SerPl Elph-Mcnc: 2.2 g/dL — ABNORMAL HIGH (ref 0.4–1.8)
Globulin, Total: 4 g/dL — ABNORMAL HIGH (ref 2.2–3.9)
IgA: 16 mg/dL — ABNORMAL LOW (ref 61–437)
IgG (Immunoglobin G), Serum: 2493 mg/dL — ABNORMAL HIGH (ref 603–1613)
IgM (Immunoglobulin M), Srm: 16 mg/dL — ABNORMAL LOW (ref 20–172)
M Protein SerPl Elph-Mcnc: 1.9 g/dL — ABNORMAL HIGH
Total Protein ELP: 7.8 g/dL (ref 6.0–8.5)

## 2021-12-01 ENCOUNTER — Encounter: Payer: Self-pay | Admitting: *Deleted

## 2021-12-02 ENCOUNTER — Ambulatory Visit (HOSPITAL_COMMUNITY): Payer: BC Managed Care – PPO

## 2021-12-02 DIAGNOSIS — C44729 Squamous cell carcinoma of skin of left lower limb, including hip: Secondary | ICD-10-CM | POA: Diagnosis not present

## 2021-12-03 ENCOUNTER — Encounter: Payer: 59 | Admitting: Oncology

## 2021-12-03 ENCOUNTER — Other Ambulatory Visit: Payer: 59

## 2021-12-04 ENCOUNTER — Inpatient Hospital Stay: Payer: BC Managed Care – PPO | Admitting: Oncology

## 2021-12-04 ENCOUNTER — Other Ambulatory Visit: Payer: Self-pay | Admitting: Radiology

## 2021-12-05 ENCOUNTER — Ambulatory Visit (HOSPITAL_COMMUNITY)
Admission: RE | Admit: 2021-12-05 | Discharge: 2021-12-05 | Disposition: A | Discharge status: Home / Self Care | Payer: BC Managed Care – PPO | Source: Ambulatory Visit | Attending: Oncology | Admitting: Oncology

## 2021-12-05 ENCOUNTER — Other Ambulatory Visit: Payer: Self-pay

## 2021-12-05 ENCOUNTER — Encounter (HOSPITAL_COMMUNITY): Payer: Self-pay

## 2021-12-05 DIAGNOSIS — Z8546 Personal history of malignant neoplasm of prostate: Secondary | ICD-10-CM | POA: Insufficient documentation

## 2021-12-05 DIAGNOSIS — D75839 Thrombocytosis, unspecified: Secondary | ICD-10-CM | POA: Insufficient documentation

## 2021-12-05 DIAGNOSIS — D649 Anemia, unspecified: Secondary | ICD-10-CM | POA: Diagnosis not present

## 2021-12-05 DIAGNOSIS — Z9079 Acquired absence of other genital organ(s): Secondary | ICD-10-CM | POA: Diagnosis not present

## 2021-12-05 DIAGNOSIS — D539 Nutritional anemia, unspecified: Secondary | ICD-10-CM | POA: Diagnosis not present

## 2021-12-05 DIAGNOSIS — I480 Paroxysmal atrial fibrillation: Secondary | ICD-10-CM | POA: Diagnosis not present

## 2021-12-05 DIAGNOSIS — C8599 Non-Hodgkin lymphoma, unspecified, extranodal and solid organ sites: Secondary | ICD-10-CM | POA: Diagnosis not present

## 2021-12-05 LAB — CBC WITH DIFFERENTIAL/PLATELET
Abs Immature Granulocytes: 0.01 10*3/uL (ref 0.00–0.07)
Basophils Absolute: 0 10*3/uL (ref 0.0–0.1)
Basophils Relative: 1 %
Eosinophils Absolute: 0 10*3/uL (ref 0.0–0.5)
Eosinophils Relative: 1 %
HCT: 40 % (ref 39.0–52.0)
Hemoglobin: 12.7 g/dL — ABNORMAL LOW (ref 13.0–17.0)
Immature Granulocytes: 0 %
Lymphocytes Relative: 26 %
Lymphs Abs: 1.2 10*3/uL (ref 0.7–4.0)
MCH: 32 pg (ref 26.0–34.0)
MCHC: 31.8 g/dL (ref 30.0–36.0)
MCV: 100.8 fL — ABNORMAL HIGH (ref 80.0–100.0)
Monocytes Absolute: 0.9 10*3/uL (ref 0.1–1.0)
Monocytes Relative: 20 %
Neutro Abs: 2.5 10*3/uL (ref 1.7–7.7)
Neutrophils Relative %: 52 %
Platelets: 419 10*3/uL — ABNORMAL HIGH (ref 150–400)
RBC: 3.97 MIL/uL — ABNORMAL LOW (ref 4.22–5.81)
RDW: 14.5 % (ref 11.5–15.5)
WBC: 4.7 10*3/uL (ref 4.0–10.5)
nRBC: 0 % (ref 0.0–0.2)

## 2021-12-05 MED ORDER — SODIUM CHLORIDE 0.9 % IV SOLN: INTRAVENOUS | Status: DC

## 2021-12-05 MED ORDER — FENTANYL CITRATE (PF) 100 MCG/2ML IJ SOLN
INTRAMUSCULAR | Status: AC | PRN
  Administered 2021-12-05: 50 ug via INTRAVENOUS

## 2021-12-05 MED ORDER — FLUMAZENIL 0.5 MG/5ML IV SOLN
INTRAVENOUS | Status: AC
  Filled 2021-12-05: qty 5

## 2021-12-05 MED ORDER — LIDOCAINE HCL (PF) 1 % IJ SOLN
INTRAMUSCULAR | Status: AC | PRN
  Administered 2021-12-05: 10 mL via INTRADERMAL

## 2021-12-05 MED ORDER — MIDAZOLAM HCL 2 MG/2ML IJ SOLN
INTRAMUSCULAR | Status: AC
  Filled 2021-12-05: qty 4

## 2021-12-05 MED ORDER — MIDAZOLAM HCL 2 MG/2ML IJ SOLN
INTRAMUSCULAR | Status: AC | PRN
Start: 2021-12-05 — End: 2021-12-05
  Administered 2021-12-05: 1 mg via INTRAVENOUS

## 2021-12-05 MED ORDER — MIDAZOLAM HCL 2 MG/2ML IJ SOLN
INTRAMUSCULAR | Status: AC | PRN
  Administered 2021-12-05: 1 mg via INTRAVENOUS

## 2021-12-05 MED ORDER — NALOXONE HCL 0.4 MG/ML IJ SOLN
INTRAMUSCULAR | Status: AC
  Filled 2021-12-05: qty 1

## 2021-12-05 MED ORDER — FENTANYL CITRATE (PF) 100 MCG/2ML IJ SOLN
INTRAMUSCULAR | Status: AC
  Filled 2021-12-05: qty 4

## 2021-12-05 MED ORDER — FENTANYL CITRATE (PF) 100 MCG/2ML IJ SOLN
INTRAMUSCULAR | Status: AC | PRN
Start: 2021-12-05 — End: 2021-12-05
  Administered 2021-12-05: 50 ug via INTRAVENOUS

## 2021-12-05 NOTE — Consult Note (Signed)
Chief Complaint: Patient was seen in consultation today for CT-guided bone marrow biopsy  Referring Physician(s): [name],[name]  Supervising Physician: Daryll Brod  Patient Status: [facility] - Out-pt  History of Present Illness: Ethan Peach, Ethan Powers is a 61 y.o. male with past medical history of basal cell skin cancer, prostate cancer with prior prostatectomy, paroxysmal atrial fibrillation with prior ablation who now presents with macrocytic anemia and positive M spike on SPEP along with elevated serum IgG and elevated kappa free light chains /kappa lambda light chain ratio as well as shotty and 1 cm bilateral axillary and inguinal lymph nodes. He is scheduled today for CT-guided bone marrow biopsy to rule out myeloma.  Past Medical History:  Diagnosis Date   Basal cell cancer    Cancer ([place]) 03/2007   prostate Cancer s/p prostatectomy   Erectile dysfunction    Paroxysmal atrial fibrillation ([place])    s/p PVI at [facility] in 2014   Seasonal allergies     Past Surgical History:  Procedure Laterality Date   ATRIAL FIBRILLATION ABLATION  2014   [facility]   ATRIAL FIBRILLATION ABLATION N/A 10/03/2019   Procedure: [place];  Surgeon: Thompson Grayer, Ethan Powers;  Location: [place] [facility];  Service: Cardiovascular;  Laterality: N/A;   CARDIOVERSION  01/15/2012   Procedure: CARDIOVERSION;  Surgeon: Sueanne Margarita, Ethan Powers;  Location: [place];  Service: Cardiovascular;  Laterality: N/A;   JOINT REPLACEMENT  10/2006   left knee replacement   LAMINECTOMY  1982   L2/L3   PROSTATECTOMY      Allergies: Patient has no known allergies.  Medications: Prior to Admission medications   Medication Sig Start Date End Date Taking? Authorizing Provider  Ferrous Sulfate (IRON) 325 (65 Fe) MG TABS 1 tablet   Yes Provider, Historical, Ethan Powers  Multiple Vitamin (MULTIVITAMIN) capsule Take 1 capsule by mouth daily.    Provider, Historical, Ethan Powers  Omega-3 Fatty Acids (FISH OIL PO) Take 1 tablet by mouth 3  (three) times a week.     Provider, Historical, Ethan Powers     Family History  Problem Relation Age of Onset   Cancer Paternal Grandfather        prostate CA    Social History   Socioeconomic History   Marital status: Married    Spouse name: Not on file   Number of children: Not on file   Years of education: Not on file   Highest education level: Not on file  Occupational History   Not on file  Tobacco Use   Smoking status: Never   Smokeless tobacco: Never  Vaping Use   Vaping Use: Never used  Substance and Sexual Activity   Alcohol use: Yes    Alcohol/week: 2.0 - 3.0 standard drinks    Types: 2 - 3 Cans of beer per week   Drug use: Never   Sexual activity: Not on file  Other Topics Concern   Not on file  Social History Narrative   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  He does have occasional headaches, night sweats, some occasional chest discomfort and dyspnea with exertion, occasional cough, and back pain.  Vital Signs:  BP (!) 142/83    Pulse 67    Temp (!) 97.5 F (36.4 C) (Oral)    Resp 18    SpO2 100%   Physical Exam awake, alert.  Chest clear to auscultation bilaterally.  Heart with regular rate and rhythm.  Abdomen soft, positive bowel sounds, nontender.  No lower extremity edema.  History of shotty 1 cm bilateral axillary and inguinal lymph nodes  Imaging: No results found.  Labs:  CBC: Recent Labs    11/24/21 1329  WBC 5.9  HGB 11.3*  HCT 36.1*  PLT 406*    COAGS: No results for input(s): INR, APTT in the last 8760 hours.  BMP: Recent Labs    11/24/21 1329  NA 137  K 5.1  CL 103  CO2 29  GLUCOSE 95  BUN 15  CALCIUM 9.2   CREATININE 0.85  GFRNONAA >60    LIVER FUNCTION TESTS: Recent Labs    11/24/21 1329  BILITOT 0.9  AST 15  ALT 10  ALKPHOS 71  PROT 7.8  ALBUMIN 4.2    TUMOR MARKERS: No results for input(s): AFPTM, CEA, CA199, CHROMGRNA in the last 8760 hours.  Assessment and Plan: 61 y.o. male with past medical history of basal cell skin cancer, prostate cancer with prior prostatectomy, paroxysmal atrial fibrillation with prior ablation who now presents with macrocytic anemia and positive M spike on SPEP along with elevated serum IgG and elevated kappa free light chains /kappa lambda light chain ratio as well as shotty and 1 cm bilateral axillary and inguinal lymph nodes. He is scheduled today for CT-guided bone marrow biopsy to rule out myeloma.Risks and benefits of procedure was discussed with the patient including, but not limited to bleeding, infection, damage to adjacent structures or low yield requiring additional tests.  All of the questions were answered and there is agreement to proceed.  Consent signed and in chart.    Thank you for this interesting consult.  I greatly enjoyed meeting Ethan Peach, Ethan Powers and look forward to participating in their care.  A copy of this report was sent to the requesting provider on this date.  Electronically Signed: D. Rowe Robert, PA-C 12/05/2021, 10:31 AM   I spent a total of   20 minutes  in face to face in clinical consultation, greater than 50% of which was counseling/coordinating care for CT-guided bone marrow biopsy

## 2021-12-05 NOTE — Procedures (Signed)
Interventional Radiology Procedure Note  Procedure: CT BM ASP AND CORE    Complications: None  Estimated Blood Loss:  MIN  Findings: 11 G CORE AND ASP    M. TREVOR Paytin Ramakrishnan, MD    

## 2021-12-08 ENCOUNTER — Ambulatory Visit (HOSPITAL_BASED_OUTPATIENT_CLINIC_OR_DEPARTMENT_OTHER)
Admission: RE | Admit: 2021-12-08 | Discharge: 2021-12-08 | Disposition: A | Discharge status: Home / Self Care | Payer: BC Managed Care – PPO | Source: Ambulatory Visit | Attending: Oncology | Admitting: Oncology

## 2021-12-08 ENCOUNTER — Other Ambulatory Visit: Payer: Self-pay | Admitting: *Deleted

## 2021-12-08 ENCOUNTER — Other Ambulatory Visit: Payer: Self-pay

## 2021-12-08 ENCOUNTER — Encounter (HOSPITAL_BASED_OUTPATIENT_CLINIC_OR_DEPARTMENT_OTHER): Payer: Self-pay

## 2021-12-08 ENCOUNTER — Inpatient Hospital Stay: Payer: BC Managed Care – PPO | Admitting: Oncology

## 2021-12-08 ENCOUNTER — Encounter: Payer: Self-pay | Admitting: *Deleted

## 2021-12-08 VITALS — BP 136/84 | HR 77 | Temp 98.7°F | Resp 18 | Ht 72.0 in | Wt 174.6 lb

## 2021-12-08 DIAGNOSIS — R591 Generalized enlarged lymph nodes: Secondary | ICD-10-CM | POA: Diagnosis not present

## 2021-12-08 DIAGNOSIS — D75839 Thrombocytosis, unspecified: Secondary | ICD-10-CM | POA: Diagnosis not present

## 2021-12-08 DIAGNOSIS — N281 Cyst of kidney, acquired: Secondary | ICD-10-CM | POA: Diagnosis not present

## 2021-12-08 DIAGNOSIS — R5381 Other malaise: Secondary | ICD-10-CM | POA: Diagnosis not present

## 2021-12-08 DIAGNOSIS — D649 Anemia, unspecified: Secondary | ICD-10-CM

## 2021-12-08 DIAGNOSIS — R160 Hepatomegaly, not elsewhere classified: Secondary | ICD-10-CM | POA: Diagnosis not present

## 2021-12-08 DIAGNOSIS — K7689 Other specified diseases of liver: Secondary | ICD-10-CM | POA: Diagnosis not present

## 2021-12-08 DIAGNOSIS — C969 Malignant neoplasm of lymphoid, hematopoietic and related tissue, unspecified: Secondary | ICD-10-CM | POA: Diagnosis not present

## 2021-12-08 DIAGNOSIS — D539 Nutritional anemia, unspecified: Secondary | ICD-10-CM | POA: Diagnosis not present

## 2021-12-08 DIAGNOSIS — Z8546 Personal history of malignant neoplasm of prostate: Secondary | ICD-10-CM | POA: Diagnosis not present

## 2021-12-08 DIAGNOSIS — R911 Solitary pulmonary nodule: Secondary | ICD-10-CM | POA: Diagnosis not present

## 2021-12-08 MED ORDER — IOHEXOL 300 MG/ML  SOLN
100.0000 mL | Freq: Once | INTRAMUSCULAR | Status: AC | PRN
  Administered 2021-12-08: 100 mL via INTRAVENOUS

## 2021-12-08 NOTE — Progress Notes (Signed)
Patient to have CT chest abdomen and pelvis today at 5pm, he has not had anything to eat and drink since 7 am, instructed to remain NPO until scan. Will have first bottle of oral contrast at 3 pm and then second bottle at 4pm and then CT at 5.  Verbalized understanding

## 2021-12-08 NOTE — Progress Notes (Signed)
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  He underwent a bone marrow biopsy 12/05/2021.  He reports tolerating procedure well.  He continues to have malaise.  No fever or night sweats.  Good appetite.  No change in the palpable lymph nodes.  No new complaint.  Objective:  Vital signs in last 24 hours:  Blood pressure 136/84, pulse 77, temperature 98.7 F (37.1 C), temperature source Oral, resp. rate 18, height 6' (1.829 m), weight 174 lb 9.6 oz (79.2 kg), SpO2 100 %.     Lymphatics: 1-1.5 cm bilateral axillary nodes.  2 cm right inguinal node, 1-2 cm bilateral femoral nodes.  Less than 1 cm lateral left inguinal nodes. Resp: Lungs clear bilaterally Cardio: Regular rate and rhythm GI: No mass, no hepatosplenomegaly Vascular: No leg edema  Lab Results:  Lab Results  Component Value Date   WBC 4.7 12/05/2021   HGB 12.7 (L) 12/05/2021   HCT 40.0 12/05/2021   MCV 100.8 (H) 12/05/2021   PLT 419 (H) 12/05/2021   NEUTROABS 2.5 12/05/2021    CMP  Lab Results  Component Value Date   NA 137 11/24/2021   K 5.1 11/24/2021   CL 103 11/24/2021   CO2 29 11/24/2021   GLUCOSE 95 11/24/2021   BUN 15 11/24/2021   CREATININE 0.85 11/24/2021   CALCIUM 9.2 11/24/2021   PROT 7.8 11/24/2021   ALBUMIN 4.2 11/24/2021   AST 15 11/24/2021   ALT 10 11/24/2021   ALKPHOS 71 11/24/2021   BILITOT 0.9 11/24/2021   GFRNONAA >60 11/24/2021   GFRAA 101 09/25/2019    Imaging:  CT BIOPSY  Result Date: 12/05/2021 INDICATION: UNEXPLAINED ANEMIA, POSITIVE IN SPIKE, CONCERN FOR MYELOMA EXAM: CT GUIDED RIGHT ILIAC BONE MARROW ASPIRATION AND CORE BIOPSY Date:  12/05/2021 12/05/2021 11:44 am Radiologist:  Ethan Mages. Daryll Brod, MD Guidance:  CT FLUOROSCOPY TIME:  Fluoroscopy Time: None. MEDICATIONS: 1% lidocaine local ANESTHESIA/SEDATION: 4.0 mg IV Versed; 100 mcg IV Fentanyl Moderate Sedation Time:  10 minute  The patient was continuously monitored during the procedure by the interventional radiology nurse under my direct supervision. CONTRAST:  None. COMPLICATIONS: None PROCEDURE: Informed consent was obtained from the patient following explanation of the procedure, risks, benefits and alternatives. The patient understands, agrees and consents for the procedure. All questions were addressed. A time out was performed. The patient was positioned prone and non-contrast localization CT was performed of the pelvis to demonstrate the iliac marrow spaces. Maximal barrier sterile technique utilized including caps, mask, sterile gowns, sterile gloves, large sterile drape, hand hygiene, and Betadine prep. Under sterile conditions and local anesthesia, an 11 gauge coaxial bone biopsy needle was advanced into the right iliac marrow space. Needle position was confirmed with CT imaging. Initially, bone marrow aspiration was performed. Next, the 11 gauge outer cannula was utilized to obtain a right iliac bone marrow core biopsy. Needle was removed. Hemostasis was obtained with compression. The patient tolerated the procedure well. Samples were prepared with the cytotechnologist. No immediate complications. IMPRESSION: CT guided right iliac bone marrow aspiration and core biopsy. Electronically Signed   By: Ethan Mages.  Ethan Powers EthanD.   On: 12/05/2021 12:35   CT BONE MARROW BIOPSY & ASPIRATION  Result Date: 12/05/2021 INDICATION: UNEXPLAINED ANEMIA, POSITIVE IN SPIKE, CONCERN FOR MYELOMA EXAM: CT GUIDED RIGHT ILIAC BONE MARROW ASPIRATION AND CORE BIOPSY Date:  12/05/2021 12/05/2021 11:44 am Radiologist:  M. Daryll Brod, MD Guidance:  CT  FLUOROSCOPY TIME:  Fluoroscopy Time: None. MEDICATIONS: 1% lidocaine local ANESTHESIA/SEDATION: 4.0 mg IV Versed; 100 mcg IV Fentanyl Moderate Sedation Time:  10 minute The patient was continuously monitored during the procedure by the interventional radiology nurse under my direct supervision. CONTRAST:  None.  COMPLICATIONS: None PROCEDURE: Informed consent was obtained from the patient following explanation of the procedure, risks, benefits and alternatives. The patient understands, agrees and consents for the procedure. All questions were addressed. A time out was performed. The patient was positioned prone and non-contrast localization CT was performed of the pelvis to demonstrate the iliac marrow spaces. Maximal barrier sterile technique utilized including caps, mask, sterile gowns, sterile gloves, large sterile drape, hand hygiene, and Betadine prep. Under sterile conditions and local anesthesia, an 11 gauge coaxial bone biopsy needle was advanced into the right iliac marrow space. Needle position was confirmed with CT imaging. Initially, bone marrow aspiration was performed. Next, the 11 gauge outer cannula was utilized to obtain a right iliac bone marrow core biopsy. Needle was removed. Hemostasis was obtained with compression. The patient tolerated the procedure well. Samples were prepared with the cytotechnologist. No immediate complications. IMPRESSION: CT guided right iliac bone marrow aspiration and core biopsy. Electronically Signed   By: Ethan Mages.  Ethan Powers EthanD.   On: 12/05/2021 12:35    Medications: I have reviewed the patient's current medications.   Assessment/Plan: Lymphoproliferative disorder Palpable lymphadenopathy IgG kappa serum M spike with elevated free kappa light chain  2.   Macrocytic anemia secondary to #1 3.   Mild thrombocytosis 4.   History of prostate cancer 5.   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  He underwent a diagnostic bone marrow biopsy on 12/05/2021.  Preliminary review of the bone marrow aspirate did not reveal evidence of multiple myeloma.  I discussed the case with Dr. Melina Powers earlier today.  She indicates the bone marrow is involved with lymphoma, most likely a low-grade lymphoma.  She favors a marginal zone lymphoma based on the histology  review.  Immunohistochemical stains are pending.   I discussed the probable diagnosis with Dr. [name].  He will be referred for staging CT scans tonight.  We will contact him with the CT results tomorrow.  The plan is to proceed with systemic therapy within the next 7-10 days if a diagnosis of low-grade lymphoma is confirmed.  We had a preliminary discussion regarding treatment with Bendamustine/rituximab if he is diagnosed with marginal zone or follicular lymphoma.  A follow-up visit, chemotherapy teaching class, and initiation of systemic therapy will be scheduled when the final pathology is available.  [name], MD  12/08/2021  4:49 PM

## 2021-12-10 LAB — SURGICAL PATHOLOGY

## 2021-12-11 ENCOUNTER — Other Ambulatory Visit: Payer: Self-pay | Admitting: Oncology

## 2021-12-11 ENCOUNTER — Other Ambulatory Visit: Payer: Self-pay | Admitting: *Deleted

## 2021-12-11 ENCOUNTER — Encounter: Payer: Self-pay | Admitting: *Deleted

## 2021-12-11 DIAGNOSIS — Z7189 Other specified counseling: Secondary | ICD-10-CM

## 2021-12-11 DIAGNOSIS — C8580 Other specified types of non-Hodgkin lymphoma, unspecified site: Secondary | ICD-10-CM | POA: Insufficient documentation

## 2021-12-11 DIAGNOSIS — C8515 Unspecified B-cell lymphoma, lymph nodes of inguinal region and lower limb: Secondary | ICD-10-CM

## 2021-12-11 NOTE — Progress Notes (Signed)
PET scan ordered for initial staging per Dr [name]

## 2021-12-11 NOTE — Progress Notes (Signed)
START ON PATHWAY REGIMEN - Lymphoma and CLL     A cycle is every 28 days:     Rituximab-xxxx      Bendamustine   **Always confirm dose/schedule in your pharmacy ordering system**  Patient Characteristics: Marginal Zone Lymphoma, Systemic, First Line, Symptomatic Disease Type: Marginal Zone Lymphoma Disease Type: Not Applicable Disease Type: Not Applicable Localized or Systemic Disease<= Systemic Line of Therapy: First Line Asymptomatic or Symptomatic<= Symptomatic Intent of Therapy: Non-Curative / Palliative Intent, Discussed with Patient 

## 2021-12-11 NOTE — Progress Notes (Signed)
PATIENT NAVIGATOR PROGRESS NOTE  Name: Glorious Peach, MD Date: 12/11/2021 MRN: [id number]  DOB: 1961/01/07   Reason for visit:  Pretreatment discussion  Comments:  Spoke with Dr [name] via phone to review upcoming appts and treatment PET scan scheduled at [facility] for 12/22/21 at noon, patient aware  Pretreatment discussion with Dr [name] scheduled for 12/17/21 at 1:50 Pretreatment labs will be drawn at 2:15 Patient education session will be after labs drawn on 2/8 First treatment scheduled for 2/9 at 0830  6.  First treatment second day will be 2/9 at 1100.  Patient verbalized understanding and encouraged to call with any issues or questions    Time spent counseling/coordinating care: 30-45 minutes

## 2021-12-13 ENCOUNTER — Other Ambulatory Visit: Payer: Self-pay | Admitting: Oncology

## 2021-12-15 ENCOUNTER — Encounter (HOSPITAL_COMMUNITY): Payer: Self-pay | Admitting: Oncology

## 2021-12-17 ENCOUNTER — Inpatient Hospital Stay: Payer: BC Managed Care – PPO | Attending: Oncology

## 2021-12-17 ENCOUNTER — Inpatient Hospital Stay: Payer: BC Managed Care – PPO

## 2021-12-17 ENCOUNTER — Other Ambulatory Visit: Payer: Self-pay

## 2021-12-17 ENCOUNTER — Inpatient Hospital Stay: Payer: BC Managed Care – PPO | Admitting: Oncology

## 2021-12-17 VITALS — BP 132/75 | HR 79 | Temp 98.2°F | Resp 18 | Ht 72.0 in | Wt 174.6 lb

## 2021-12-17 DIAGNOSIS — Z5111 Encounter for antineoplastic chemotherapy: Secondary | ICD-10-CM | POA: Diagnosis not present

## 2021-12-17 DIAGNOSIS — R530 Neoplastic (malignant) related fatigue: Secondary | ICD-10-CM | POA: Insufficient documentation

## 2021-12-17 DIAGNOSIS — D75839 Thrombocytosis, unspecified: Secondary | ICD-10-CM | POA: Diagnosis not present

## 2021-12-17 DIAGNOSIS — C8515 Unspecified B-cell lymphoma, lymph nodes of inguinal region and lower limb: Secondary | ICD-10-CM | POA: Diagnosis not present

## 2021-12-17 DIAGNOSIS — Z5112 Encounter for antineoplastic immunotherapy: Secondary | ICD-10-CM | POA: Diagnosis not present

## 2021-12-17 DIAGNOSIS — L299 Pruritus, unspecified: Secondary | ICD-10-CM | POA: Insufficient documentation

## 2021-12-17 DIAGNOSIS — D539 Nutritional anemia, unspecified: Secondary | ICD-10-CM | POA: Diagnosis not present

## 2021-12-17 DIAGNOSIS — C8330 Diffuse large B-cell lymphoma, unspecified site: Secondary | ICD-10-CM | POA: Insufficient documentation

## 2021-12-17 DIAGNOSIS — Z8546 Personal history of malignant neoplasm of prostate: Secondary | ICD-10-CM | POA: Insufficient documentation

## 2021-12-17 LAB — URIC ACID: Uric Acid, Serum: 5.8 mg/dL (ref 3.7–8.6)

## 2021-12-17 LAB — CMP (CANCER CENTER ONLY)
ALT: 11 U/L (ref 0–44)
AST: 17 U/L (ref 15–41)
Albumin: 4.3 g/dL (ref 3.5–5.0)
Alkaline Phosphatase: 66 U/L (ref 38–126)
Anion gap: 8 (ref 5–15)
BUN: 14 mg/dL (ref 8–23)
CO2: 26 mmol/L (ref 22–32)
Calcium: 9 mg/dL (ref 8.9–10.3)
Chloride: 100 mmol/L (ref 98–111)
Creatinine: 0.96 mg/dL (ref 0.61–1.24)
GFR, Estimated: >60 mL/min (ref >60)
Glucose, Bld: 108 mg/dL — ABNORMAL HIGH (ref 70–99)
Potassium: 4.3 mmol/L (ref 3.5–5.1)
Sodium: 134 mmol/L — ABNORMAL LOW (ref 135–145)
Total Bilirubin: 1 mg/dL (ref 0.3–1.2)
Total Protein: 8 g/dL (ref 6.5–8.1)

## 2021-12-17 LAB — HEPATITIS B SURFACE ANTIGEN: Hepatitis B Surface Ag: NONREACTIVE

## 2021-12-17 LAB — HEPATITIS B CORE ANTIBODY, TOTAL: Hep B Core Total Ab: NONREACTIVE

## 2021-12-17 MED ORDER — PROCHLORPERAZINE MALEATE 10 MG PO TABS: 10.0000 mg | ORAL_TABLET | Freq: Four times a day (QID) | ORAL | 1 refills | Status: DC | PRN

## 2021-12-17 MED ORDER — ONDANSETRON HCL 8 MG PO TABS
8.0000 mg | ORAL_TABLET | Freq: Three times a day (TID) | ORAL | 1 refills | Status: DC | PRN
Start: 2021-12-17 — End: 2022-05-29

## 2021-12-17 NOTE — Progress Notes (Signed)
°[facility] °OFFICE PROGRESS NOTE ° ° °Diagnosis: Non-Hodgkin's and Phoma ° °INTERVAL HISTORY:  ° °Ethan Powers returns as scheduled.  He continues to have malaise and delayed recovery from exercise.  His heart rate remains elevated for an extended time after exercising.  He has been able to play pickle ball and golf.  No fever.  "Minimal "night sweats.  Stable palpable lymph nodes. ° °Objective: ° °Vital signs in last 24 hours: ° °Blood pressure 132/75, pulse 79, temperature 98.2 °F (36.8 °C), temperature source Oral, resp. rate 18, height 6' (1.829 m), weight 174 lb 9.6 oz (79.2 kg), SpO2 98 %. °  ° °Lymphatics: 1/2-1 cm bilateral axillary nodes.  No cervical or supraclavicular nodes.  Multiple 1-3 cm bilateral inguinal/upper femoral nodes °Resp: Lungs clear bilaterally °Cardio: Regular rate and rhythm °GI: No hepatosplenomegaly, nontender °Vascular: No leg edema  °Skin: Lipomas at the left lower neck/upper chest and left mid back ° ° °Lab Results: ° °Lab Results  °Component Value Date  ° WBC 4.7 12/05/2021  ° HGB 12.7 (L) 12/05/2021  ° HCT 40.0 12/05/2021  ° MCV 100.8 (H) 12/05/2021  ° PLT 419 (H) 12/05/2021  ° NEUTROABS 2.5 12/05/2021  ° ° °CMP  °Lab Results  °Component Value Date  ° NA 134 (L) 12/17/2021  ° K 4.3 12/17/2021  ° CL 100 12/17/2021  ° CO2 26 12/17/2021  ° GLUCOSE 108 (H) 12/17/2021  ° BUN 14 12/17/2021  ° CREATININE 0.96 12/17/2021  ° CALCIUM 9.0 12/17/2021  ° PROT 8.0 12/17/2021  ° ALBUMIN 4.3 12/17/2021  ° AST 17 12/17/2021  ° ALT 11 12/17/2021  ° ALKPHOS 66 12/17/2021  ° BILITOT 1.0 12/17/2021  ° GFRNONAA >60 12/17/2021  ° GFRAA 101 09/25/2019  ° ° ° °Medications: I have reviewed the patient's current medications. ° ° °Assessment/Plan: ° °Low-grade B-cell non-Hodgkin's lymphoma °Palpable lymphadenopathy °IgG kappa serum M spike with elevated free kappa light chain °Bone marrow biopsy 12/05/2021-hypercellular marrow, 95%, diffusely infiltrated by B-cell lymphoma, no expression of CD5  or CD10, CD20 positive.  Differential diagnosis includes marginal zone lymphoma and lymphoplasmacytic lymphoma, flow cytometry identified a kappa restricted B-cell population compromising 80% of lymphocytes, 46 XY karyotype °CTs 12/08/2021-lipoma to left lower neck/upper chest, hepatomegaly with numerous liver cysts, prominent right inguinal node, no splenomegaly, no suspicious bone lesions, 9 mm focus of hyperenhancement in the left hepatic lobe, small right adrenal nodule ° °2.   Macrocytic anemia secondary to #1 °3.   Mild thrombocytosis °4.   History of prostate cancer °5.   Malaise secondary to #1 ° ° ° °Disposition: °Ethan Powers appears stable.  He has been diagnosed with a low-grade B-cell lymphoma, most likely marginal zone lymphoma.  He has small palpable lymph nodes and diffuse bone marrow involvement with lymphoma. ° °We discussed treatment options including observation, single agent rituximab, and Bendamustine/rituximab.  I recommend treatment since he is symptomatic from the lymphoma.  We discussed the expected significant response rate with either single agent rituximab or Bendamustine/rituximab.  The response durability is predicted to be longer with Bendamustine/rituximab.  He favors Bendamustine/rituximab. ° °We reviewed potential toxicities associated with Bendamustine including the chance of nausea, mucositis, alopecia, rash, and hematologic toxicity.  We discussed the allergic reaction, pneumonitis, CNS toxicity, atypical infections, reactivation of hepatitis B and hematologic toxicity associated with rituximab.  He agrees to proceed.  He will obtain a chemotherapy teaching class today. °He will begin systemic therapy on 12/18/2021.  He will return for an office visit   and nadir CBC in 2 weeks.  Ethan Powers attended chemotherapy teaching class today. ° °I will dose reduce the Bendamustine with cycle 1 given the extensive bone marrow involvement with lymphoma. ° °He is scheduled for restaging PET scan  12/22/2021. ° °50 minutes were spent on patient interview/examination, chart review, and documentation.  Greater than 50% of the time was used for counseling and coordination of care. °Ethan [name], MD ° °12/17/2021  °3:20 PM ° ° °

## 2021-12-18 ENCOUNTER — Inpatient Hospital Stay: Payer: BC Managed Care – PPO

## 2021-12-18 ENCOUNTER — Encounter: Payer: Self-pay | Admitting: *Deleted

## 2021-12-18 VITALS — BP 108/60 | HR 85 | Temp 98.1°F | Resp 18

## 2021-12-18 DIAGNOSIS — D75839 Thrombocytosis, unspecified: Secondary | ICD-10-CM | POA: Diagnosis not present

## 2021-12-18 DIAGNOSIS — Z8546 Personal history of malignant neoplasm of prostate: Secondary | ICD-10-CM | POA: Diagnosis not present

## 2021-12-18 DIAGNOSIS — C8330 Diffuse large B-cell lymphoma, unspecified site: Secondary | ICD-10-CM | POA: Diagnosis not present

## 2021-12-18 DIAGNOSIS — C8580 Other specified types of non-Hodgkin lymphoma, unspecified site: Secondary | ICD-10-CM

## 2021-12-18 DIAGNOSIS — L299 Pruritus, unspecified: Secondary | ICD-10-CM | POA: Diagnosis not present

## 2021-12-18 DIAGNOSIS — Z5112 Encounter for antineoplastic immunotherapy: Secondary | ICD-10-CM | POA: Diagnosis not present

## 2021-12-18 DIAGNOSIS — D539 Nutritional anemia, unspecified: Secondary | ICD-10-CM | POA: Diagnosis not present

## 2021-12-18 DIAGNOSIS — Z5111 Encounter for antineoplastic chemotherapy: Secondary | ICD-10-CM | POA: Diagnosis not present

## 2021-12-18 DIAGNOSIS — R530 Neoplastic (malignant) related fatigue: Secondary | ICD-10-CM | POA: Diagnosis not present

## 2021-12-18 LAB — BETA 2 MICROGLOBULIN, SERUM: Beta-2 Microglobulin: 3.1 mg/L — ABNORMAL HIGH (ref 0.6–2.4)

## 2021-12-18 MED ORDER — DIPHENHYDRAMINE HCL 50 MG/ML IJ SOLN
50.0000 mg | Freq: Once | INTRAMUSCULAR | Status: AC | PRN
  Administered 2021-12-18: 25 mg via INTRAVENOUS

## 2021-12-18 MED ORDER — SODIUM CHLORIDE 0.9 % IV SOLN: Freq: Once | INTRAVENOUS | Status: AC

## 2021-12-18 MED ORDER — SODIUM CHLORIDE 0.9 % IV SOLN
70.0000 mg/m2 | Freq: Once | INTRAVENOUS | Status: AC
  Administered 2021-12-18: 150 mg via INTRAVENOUS
  Filled 2021-12-18: qty 6

## 2021-12-18 MED ORDER — FAMOTIDINE IN NACL 20-0.9 MG/50ML-% IV SOLN
20.0000 mg | Freq: Once | INTRAVENOUS | Status: AC | PRN
  Administered 2021-12-18: 20 mg via INTRAVENOUS
  Filled 2021-12-18: qty 50

## 2021-12-18 MED ORDER — SODIUM CHLORIDE 0.9 % IV SOLN: Freq: Once | INTRAVENOUS | Status: DC | PRN

## 2021-12-18 MED ORDER — SODIUM CHLORIDE 0.9 % IV SOLN
10.0000 mg | Freq: Once | INTRAVENOUS | Status: AC
  Administered 2021-12-18: 10 mg via INTRAVENOUS
  Filled 2021-12-18: qty 1

## 2021-12-18 MED ORDER — SODIUM CHLORIDE 0.9 % IV SOLN
375.0000 mg/m2 | Freq: Once | INTRAVENOUS | Status: AC
  Administered 2021-12-18: 800 mg via INTRAVENOUS
  Filled 2021-12-18: qty 30

## 2021-12-18 MED ORDER — METHYLPREDNISOLONE SODIUM SUCC 125 MG IJ SOLR: 125.0000 mg | Freq: Once | INTRAMUSCULAR | Status: DC | PRN

## 2021-12-18 MED ORDER — ACETAMINOPHEN 325 MG PO TABS
650.0000 mg | ORAL_TABLET | Freq: Once | ORAL | Status: AC
  Administered 2021-12-18: 650 mg via ORAL
  Filled 2021-12-18: qty 2

## 2021-12-18 MED ORDER — EPINEPHRINE 0.3 MG/0.3ML IJ SOAJ
0.3000 mg | Freq: Once | INTRAMUSCULAR | Status: DC | PRN
  Filled 2021-12-18: qty 0.6

## 2021-12-18 MED ORDER — DIPHENHYDRAMINE HCL 25 MG PO CAPS
50.0000 mg | ORAL_CAPSULE | Freq: Once | ORAL | Status: AC
  Administered 2021-12-18: 50 mg via ORAL
  Filled 2021-12-18: qty 2

## 2021-12-18 MED ORDER — ALBUTEROL SULFATE (2.5 MG/3ML) 0.083% IN NEBU: 2.5000 mg | INHALATION_SOLUTION | Freq: Once | RESPIRATORY_TRACT | Status: DC | PRN

## 2021-12-18 MED ORDER — PALONOSETRON HCL INJECTION 0.25 MG/5ML
0.2500 mg | Freq: Once | INTRAVENOUS | Status: AC
  Administered 2021-12-18: 0.25 mg via INTRAVENOUS
  Filled 2021-12-18: qty 5

## 2021-12-18 NOTE — Patient Instructions (Signed)
[facility]   Discharge Instructions: Thank you for choosing [facility] to provide your oncology and hematology care.   If you have a lab appointment with the [facility], please go directly to the [facility] and check in at the registration area.   Wear comfortable clothing and clothing appropriate for easy access to any Portacath or PICC line.   We strive to give you quality time with your provider. You may need to reschedule your appointment if you arrive late (15 or more minutes).  Arriving late affects you and other patients whose appointments are after yours.  Also, if you miss three or more appointments without notifying the office, you may be dismissed from the clinic at the providers discretion.      For prescription refill requests, have your pharmacy contact our office and allow 72 hours for refills to be completed.    Today you received the following chemotherapy and/or immunotherapy agents Rituximab, Bendeka      To help prevent nausea and vomiting after your treatment, we encourage you to take your nausea medication as directed.  BELOW ARE SYMPTOMS THAT SHOULD BE REPORTED IMMEDIATELY: *FEVER GREATER THAN 100.4 F (38 C) OR HIGHER *CHILLS OR SWEATING *NAUSEA AND VOMITING THAT IS NOT CONTROLLED WITH YOUR NAUSEA MEDICATION *UNUSUAL SHORTNESS OF BREATH *UNUSUAL BRUISING OR BLEEDING *URINARY PROBLEMS (pain or burning when urinating, or frequent urination) *BOWEL PROBLEMS (unusual diarrhea, constipation, pain near the anus) TENDERNESS IN MOUTH AND THROAT WITH OR WITHOUT PRESENCE OF ULCERS (sore throat, sores in mouth, or a toothache) UNUSUAL RASH, SWELLING OR PAIN  UNUSUAL VAGINAL DISCHARGE OR ITCHING   Items with * indicate a potential emergency and should be followed up as soon as possible or go to the Emergency Department if any problems should occur.  Please show the CHEMOTHERAPY ALERT CARD or IMMUNOTHERAPY ALERT CARD at  check-in to the Emergency Department and triage nurse.  Should you have questions after your visit or need to cancel or reschedule your appointment, please contact [facility]  Dept: [phone]  and follow the prompts.  Office hours are 8:00 a.m. to 4:30 p.m. Monday - Friday. Please note that voicemails left after 4:00 p.m. may not be returned until the following business day.  We are closed weekends and major holidays. You have access to a nurse at all times for urgent questions. Please call the main number to the clinic Dept: [phone] and follow the prompts.   For any non-urgent questions, you may also contact your provider using MyChart. We now offer e-Visits for anyone 18 and older to request care online for non-urgent symptoms. For details visit [email or web address].   Also download the MyChart app! Go to the app store, search "MyChart", open the app, select [facility], and log in with your MyChart username and password.  Due to Covid, a mask is required upon entering the hospital/clinic. If you do not have a mask, one will be given to you upon arrival. For doctor visits, patients may have 1 support person aged 66 or older with them. For treatment visits, patients cannot have anyone with them due to current Covid guidelines and our immunocompromised population.   Rituximab Injection What is this medication? RITUXIMAB (ri TUX i mab) is a monoclonal antibody. It is used to treat certain types of cancer like non-Hodgkin lymphoma and chronic lymphocytic leukemia. It is also used to treat rheumatoid arthritis, granulomatosis with polyangiitis, microscopic polyangiitis, and pemphigus vulgaris.  This medicine may be used for other purposes; ask your health care provider or pharmacist if you have questions. COMMON BRAND NAME(S): RIABNI, Rituxan, RUXIENCE What should I tell my care team before I take this medication? They need to know if you have any of these  conditions: chest pain heart disease infection especially a viral infection such as chickenpox, cold sores, hepatitis B, or herpes immune system problems irregular heartbeat or rhythm kidney disease low blood counts (white cells, platelets, or red cells) lung disease recent or upcoming vaccine an unusual or allergic reaction to rituximab, other medicines, foods, dyes, or preservatives pregnant or trying to get pregnant breast-feeding How should I use this medication? This medicine is injected into a vein. It is given by a health care provider in a hospital or clinic setting. A special MedGuide will be given to you before each treatment. Be sure to read this information carefully each time. Talk to your health care provider about the use of this medicine in children. While this drug may be prescribed for children as young as 6 months for selected conditions, precautions do apply. Overdosage: If you think you have taken too much of this medicine contact a poison control center or emergency room at once. NOTE: This medicine is only for you. Do not share this medicine with others. What if I miss a dose? Keep appointments for follow-up doses. It is important not to miss your dose. Call your health care provider if you are unable to keep an appointment. What may interact with this medication? Do not take this medicine with any of the following medicines: live vaccines This medicine may also interact with the following medicines: cisplatin This list may not describe all possible interactions. Give your health care provider a list of all the medicines, herbs, non-prescription drugs, or dietary supplements you use. Also tell them if you smoke, drink alcohol, or use illegal drugs. Some items may interact with your medicine. What should I watch for while using this medication? Your condition will be monitored carefully while you are receiving this medicine. You may need blood work done while you are  taking this medicine. This medicine can cause serious infusion reactions. To reduce the risk your health care provider may give you other medicines to take before receiving this one. Be sure to follow the directions from your health care provider. This medicine may increase your risk of getting an infection. Call your health care provider for advice if you get a fever, chills, sore throat, or other symptoms of a cold or flu. Do not treat yourself. Try to avoid being around people who are sick. Call your health care provider if you are around anyone with measles, chickenpox, or if you develop sores or blisters that do not heal properly. Avoid taking medicines that contain aspirin, acetaminophen, ibuprofen, naproxen, or ketoprofen unless instructed by your health care provider. These medicines may hide a fever. This medicine may cause serious skin reactions. They can happen weeks to months after starting the medicine. Contact your health care provider right away if you notice fevers or flu-like symptoms with a rash. The rash may be red or purple and then turn into blisters or peeling of the skin. Or, you might notice a red rash with swelling of the face, lips or lymph nodes in your neck or under your arms. In some patients, this medicine may cause a serious brain infection that may cause death. If you have any problems seeing, thinking, speaking, walking, or standing,   tell your healthcare professional right away. If you cannot reach your healthcare professional, urgently seek other source of medical care. Do not become pregnant while taking this medicine or for at least 12 months after stopping it. Women should inform their health care provider if they wish to become pregnant or think they might be pregnant. There is potential for serious harm to an unborn child. Talk to your health care provider for more information. Women should use a reliable form of birth control while taking this medicine and for 12 months  after stopping it. Do not breast-feed while taking this medicine or for at least 6 months after stopping it. What side effects may I notice from receiving this medication? Side effects that you should report to your health care provider as soon as possible: allergic reactions (skin rash, itching or hives; swelling of the face, lips, or tongue) diarrhea edema (sudden weight gain; swelling of the ankles, feet, hands or other unusual swelling; trouble breathing) fast, irregular heartbeat heart attack (trouble breathing; pain or tightness in the chest, neck, back or arms; unusually weak or tired) infection (fever, chills, cough, sore throat, pain or trouble passing urine) kidney injury (trouble passing urine or change in the amount of urine) liver injury (dark yellow or brown urine; general ill feeling or flu-like symptoms; loss of appetite, right upper belly pain; unusually weak or tired, yellowing of the eyes or skin) low blood pressure (dizziness; feeling faint or lightheaded, falls; unusually weak or tired) low red blood cell counts (trouble breathing; feeling faint; lightheaded, falls; unusually weak or tired) mouth sores redness, blistering, peeling, or loosening of the skin, including inside the mouth stomach pain unusual bruising or bleeding wheezing (trouble breathing with loud or whistling sounds) vomiting Side effects that usually do not require medical attention (report to your health care provider if they continue or are bothersome): headache joint pain muscle cramps, pain nausea This list may not describe all possible side effects. Call your doctor for medical advice about side effects. You may report side effects to FDA at 1-800-FDA-1088. Where should I keep my medication? This medicine is given in a hospital or clinic. It will not be stored at home. NOTE: This sheet is a summary. It may not cover all possible information. If you have questions about this medicine, talk to your  doctor, pharmacist, or health care provider.  2022 Elsevier/Gold Standard (2020-10-28 00:00:00)  Bendamustine Injection What is this medication? BENDAMUSTINE (BEN da MUS teen) is a chemotherapy drug. It is used to treat chronic lymphocytic leukemia and non-Hodgkin lymphoma. This medicine may be used for other purposes; ask your health care provider or pharmacist if you have questions. COMMON BRAND NAME(S): Kristine Royal, Treanda What should I tell my care team before I take this medication? They need to know if you have any of these conditions: infection (especially a virus infection such as chickenpox, cold sores, or herpes) kidney disease liver disease an unusual or allergic reaction to bendamustine, mannitol, other medicines, foods, dyes, or preservatives pregnant or trying to get pregnant breast-feeding How should I use this medication? This medicine is for infusion into a vein. It is given by a health care professional in a hospital or clinic setting. Talk to your pediatrician regarding the use of this medicine in children. Special care may be needed. Overdosage: If you think you have taken too much of this medicine contact a poison control center or emergency room at once. NOTE: This medicine is only for you. Do not  share this medicine with others. What if I miss a dose? It is important not to miss your dose. Call your doctor or health care professional if you are unable to keep an appointment. What may interact with this medication? Do not take this medicine with any of the following medications: clozapine This medicine may also interact with the following medications: atazanavir cimetidine ciprofloxacin enoxacin fluvoxamine medicines for seizures like carbamazepine and phenobarbital mexiletine rifampin tacrine thiabendazole zileuton This list may not describe all possible interactions. Give your health care provider a list of all the medicines, herbs,  non-prescription drugs, or dietary supplements you use. Also tell them if you smoke, drink alcohol, or use illegal drugs. Some items may interact with your medicine. What should I watch for while using this medication? This drug may make you feel generally unwell. This is not uncommon, as chemotherapy can affect healthy cells as well as cancer cells. Report any side effects. Continue your course of treatment even though you feel ill unless your doctor tells you to stop. You may need blood work done while you are taking this medicine. Call your doctor or healthcare provider for advice if you get a fever, chills or sore throat, or other symptoms of a cold or flu. Do not treat yourself. This drug decreases your body's ability to fight infections. Try to avoid being around people who are sick. This medicine may cause serious skin reactions. They can happen weeks to months after starting the medicine. Contact your healthcare provider right away if you notice fevers or flu-like symptoms with a rash. The rash may be red or purple and then turn into blisters or peeling of the skin. Or, you might notice a red rash with swelling of the face, lips or lymph nodes in your neck or under your arms. In some patients, this medicine may cause a serious brain infection that may cause death. If you have any problems seeing, thinking, speaking, walking, or standing, tell your health care provider right away. If you cannot reach your health care provider, urgently seek other source of medical care. This medicine may increase your risk to bruise or bleed. Call your doctor or healthcare provider if you notice any unusual bleeding. Talk to your doctor about your risk of cancer. You may be more at risk for certain types of cancers if you take this medicine. This medicine may increase your risk of skin cancer. Check your skin for changes to moles or for new growths while taking this medicine. Call your health care provider if you  notice any of these skin changes. Do not become pregnant while taking this medicine or for at least 6 months after stopping it. Women should inform their doctor if they wish to become pregnant or think they might be pregnant. Men should not father a child while taking this medicine and for at least 3 months after stopping it. There is a potential for serious side effects to an unborn child. Talk to your healthcare provider or pharmacist for more information. Do not breast-feed an infant while taking this medicine or for at least 1 week after stopping it. This medicine may make it more difficult to father a child. You should talk with your doctor or healthcare provider if you are concerned about your fertility. What side effects may I notice from receiving this medication? Side effects that you should report to your doctor or health care professional as soon as possible: allergic reactions like skin rash, itching or hives, swelling of  the face, lips, or tongue low blood counts - this medicine may decrease the number of white blood cells, red blood cells and platelets. You may be at increased risk for infections and bleeding. rash, fever, and swollen lymph nodes redness, blistering, peeling, or loosening of the skin, including inside the mouth signs of infection like fever or chills, cough, sore throat, pain or difficulty passing urine signs of decreased platelets or bleeding like bruising, pinpoint red spots on the skin, black, tarry stools, blood in the urine signs of decreased red blood cells like being unusually weak or tired, fainting spells, lightheadedness signs and symptoms of kidney injury like trouble passing urine or change in the amount of urine signs and symptoms of liver injury like dark yellow or brown urine; general ill feeling or flu-like symptoms; light-colored stools; loss of appetite; nausea; right upper belly pain; unusually weak or tired; yellowing of the eyes or skin Side effects  that usually do not require medical attention (report to your doctor or health care professional if they continue or are bothersome): constipation decreased appetite diarrhea headache mouth sores nausea, vomiting tiredness This list may not describe all possible side effects. Call your doctor for medical advice about side effects. You may report side effects to FDA at 1-800-FDA-1088. Where should I keep my medication? This drug is given in a hospital or clinic and will not be stored at home. NOTE: This sheet is a summary. It may not cover all possible information. If you have questions about this medicine, talk to your doctor, pharmacist, or health care provider.  2022 Elsevier/Gold Standard (2020-04-23 00:00:00)

## 2021-12-18 NOTE — Progress Notes (Signed)
Patient presents for treatment. RN assessment completed along with the following:  Labs/vitals reviewed - Yes, and within treatment parameters.  Ok to treat without CBC per Dr [name] Weight within 10% of previous measurement - Yes Informed consent completed and reflects current therapy/intent - Yes, on date 12/18/21             Provider progress note reviewed - Patient not seen by provider today. Most recent note dated 12/17/21 reviewed. Treatment/Antibody/Supportive plan reviewed - Yes, and Reduced bendeka for today S&H and other orders reviewed - Yes, and there are no additional orders identified. Previous treatment date reviewed - Yes, and the appropriate amount of time has elapsed between treatments. Clinic Hand Off Received from - none   Patient to proceed with treatment.   Hypersensitivity Reaction note  Date of event: 12/18/21 Time of event: 1230 Generic name of drug involved: Rituximab Name of provider notified of the hypersensitivity reaction: Dr Edrick Kins and Leander Rams, NP Was agent that likely caused hypersensitivity reaction added to Allergies List within EMR? yes Chain of events including reaction signs/symptoms, treatment administered, and outcome (e.g., drug resumed; drug discontinued; sent to Emergency Department; etc.) 12:30  Pt returned from the restroom, complained of itching on his scalp, RN noted redness around the hairline. Rituximab infusion stopped, Lattie Haw T, and Dr [name] notified and Normal saline infusion started at 24 and Emergency drugs started. Vital signs obtained (see flowsheet and MAR) 1301 reassessed patient, redness, hives and redness subsiding. 1326 reassessed patient.  Vital signs are stable redness and hives are gone. Small rash on right side of abdomen and "a little itching near the navel".  1337 Vital signs taken and Rituximab restarted at dose before reaction 250 mg at rate 103 for 52 mls. Will monitor patient closely 1307 infusion completed. Pt  complains of "a little itching in his toes" assessed toes, no redness or rash noted.  No other complaints. Leander Rams, NP at bedside at end of treatment. No other orders given. Patient ok to be discharged. PT VS stable upon discharge Marylu Lund, RN 12/18/2021 12:51 PM

## 2021-12-18 NOTE — Progress Notes (Signed)
Pharmacist Chemotherapy Monitoring - Initial Assessment    Anticipated start date: 12/18/21   The following has been reviewed per standard work regarding the patient's treatment regimen: The patient's diagnosis, treatment plan and drug doses, and organ/hematologic function Lab orders and baseline tests specific to treatment regimen  The treatment plan start date, drug sequencing, and pre-medications Prior authorization status  Patient's documented medication list, including drug-drug interaction screen and prescriptions for anti-emetics and supportive care specific to the treatment regimen The drug concentrations, fluid compatibility, administration routes, and timing of the medications to be used The patient's access for treatment and lifetime cumulative dose history, if applicable  The patient's medication allergies and previous infusion related reactions, if applicable   Changes made to treatment plan:  N/A  Follow up needed:  N/A   Patrica Duel, RPH, 12/18/2021  9:10 AM

## 2021-12-18 NOTE — Progress Notes (Signed)
PATIENT NAVIGATOR PROGRESS NOTE  Name: Ethan Peach, MD Date: 12/18/2021 MRN: [id number]  DOB: 05/03/1961   Reason for visit:  First treatment with Rituxan/bendamustine  Comments:  Met with Dr [name] during first treatment. He states that overall he is doing well.    Encouraged him to call with any issues or questions    Time spent counseling/coordinating care: 30-45 minutes

## 2021-12-19 ENCOUNTER — Inpatient Hospital Stay: Payer: BC Managed Care – PPO

## 2021-12-19 ENCOUNTER — Other Ambulatory Visit: Payer: Self-pay

## 2021-12-19 ENCOUNTER — Encounter: Payer: Self-pay | Admitting: Oncology

## 2021-12-19 VITALS — BP 118/64 | HR 73 | Temp 98.5°F | Resp 17

## 2021-12-19 DIAGNOSIS — R530 Neoplastic (malignant) related fatigue: Secondary | ICD-10-CM | POA: Diagnosis not present

## 2021-12-19 DIAGNOSIS — D539 Nutritional anemia, unspecified: Secondary | ICD-10-CM | POA: Diagnosis not present

## 2021-12-19 DIAGNOSIS — Z5112 Encounter for antineoplastic immunotherapy: Secondary | ICD-10-CM | POA: Diagnosis not present

## 2021-12-19 DIAGNOSIS — Z5111 Encounter for antineoplastic chemotherapy: Secondary | ICD-10-CM | POA: Diagnosis not present

## 2021-12-19 DIAGNOSIS — L299 Pruritus, unspecified: Secondary | ICD-10-CM | POA: Diagnosis not present

## 2021-12-19 DIAGNOSIS — Z8546 Personal history of malignant neoplasm of prostate: Secondary | ICD-10-CM | POA: Diagnosis not present

## 2021-12-19 DIAGNOSIS — C8330 Diffuse large B-cell lymphoma, unspecified site: Secondary | ICD-10-CM | POA: Diagnosis not present

## 2021-12-19 DIAGNOSIS — D75839 Thrombocytosis, unspecified: Secondary | ICD-10-CM | POA: Diagnosis not present

## 2021-12-19 DIAGNOSIS — C8580 Other specified types of non-Hodgkin lymphoma, unspecified site: Secondary | ICD-10-CM

## 2021-12-19 MED ORDER — SODIUM CHLORIDE 0.9 % IV SOLN
70.0000 mg/m2 | Freq: Once | INTRAVENOUS | Status: AC
  Administered 2021-12-19: 150 mg via INTRAVENOUS
  Filled 2021-12-19: qty 6

## 2021-12-19 MED ORDER — SODIUM CHLORIDE 0.9 % IV SOLN: Freq: Once | INTRAVENOUS | Status: AC

## 2021-12-19 MED ORDER — SODIUM CHLORIDE 0.9 % IV SOLN
10.0000 mg | Freq: Once | INTRAVENOUS | Status: AC
  Administered 2021-12-19: 10 mg via INTRAVENOUS
  Filled 2021-12-19: qty 1

## 2021-12-19 NOTE — Patient Instructions (Signed)
[facility]   Discharge Instructions: Thank you for choosing [facility] to provide your oncology and hematology care.   If you have a lab appointment with the [facility], please go directly to the [facility] and check in at the registration area.   Wear comfortable clothing and clothing appropriate for easy access to any Portacath or PICC line.   We strive to give you quality time with your provider. You may need to reschedule your appointment if you arrive late (15 or more minutes).  Arriving late affects you and other patients whose appointments are after yours.  Also, if you miss three or more appointments without notifying the office, you may be dismissed from the clinic at the providers discretion.      For prescription refill requests, have your pharmacy contact our office and allow 72 hours for refills to be completed.    Today you received the following chemotherapy and/or immunotherapy agents Bendeka      To help prevent nausea and vomiting after your treatment, we encourage you to take your nausea medication as directed.  BELOW ARE SYMPTOMS THAT SHOULD BE REPORTED IMMEDIATELY: *FEVER GREATER THAN 100.4 F (38 C) OR HIGHER *CHILLS OR SWEATING *NAUSEA AND VOMITING THAT IS NOT CONTROLLED WITH YOUR NAUSEA MEDICATION *UNUSUAL SHORTNESS OF BREATH *UNUSUAL BRUISING OR BLEEDING *URINARY PROBLEMS (pain or burning when urinating, or frequent urination) *BOWEL PROBLEMS (unusual diarrhea, constipation, pain near the anus) TENDERNESS IN MOUTH AND THROAT WITH OR WITHOUT PRESENCE OF ULCERS (sore throat, sores in mouth, or a toothache) UNUSUAL RASH, SWELLING OR PAIN  UNUSUAL VAGINAL DISCHARGE OR ITCHING   Items with * indicate a potential emergency and should be followed up as soon as possible or go to the Emergency Department if any problems should occur.  Please show the CHEMOTHERAPY ALERT CARD or IMMUNOTHERAPY ALERT CARD at check-in to the  Emergency Department and triage nurse.  Should you have questions after your visit or need to cancel or reschedule your appointment, please contact [facility]  Dept: [phone]  and follow the prompts.  Office hours are 8:00 a.m. to 4:30 p.m. Monday - Friday. Please note that voicemails left after 4:00 p.m. may not be returned until the following business day.  We are closed weekends and major holidays. You have access to a nurse at all times for urgent questions. Please call the main number to the clinic Dept: [phone] and follow the prompts.   For any non-urgent questions, you may also contact your provider using MyChart. We now offer e-Visits for anyone 77 and older to request care online for non-urgent symptoms. For details visit [email or web address].   Also download the MyChart app! Go to the app store, search "MyChart", open the app, select [facility], and log in with your MyChart username and password.  Due to Covid, a mask is required upon entering the hospital/clinic. If you do not have a mask, one will be given to you upon arrival. For doctor visits, patients may have 1 support person aged 40 or older with them. For treatment visits, patients cannot have anyone with them due to current Covid guidelines and our immunocompromised population.   Bendamustine Injection What is this medication? BENDAMUSTINE (BEN da MUS teen) is a chemotherapy drug. It is used to treat chronic lymphocytic leukemia and non-Hodgkin lymphoma. This medicine may be used for other purposes; ask your health care provider or pharmacist if you have questions. COMMON BRAND NAME(S):  Kristine Royal, Treanda What should I tell my care team before I take this medication? They need to know if you have any of these conditions: infection (especially a virus infection such as chickenpox, cold sores, or herpes) kidney disease liver disease an unusual or allergic reaction to bendamustine,  mannitol, other medicines, foods, dyes, or preservatives pregnant or trying to get pregnant breast-feeding How should I use this medication? This medicine is for infusion into a vein. It is given by a health care professional in a hospital or clinic setting. Talk to your pediatrician regarding the use of this medicine in children. Special care may be needed. Overdosage: If you think you have taken too much of this medicine contact a poison control center or emergency room at once. NOTE: This medicine is only for you. Do not share this medicine with others. What if I miss a dose? It is important not to miss your dose. Call your doctor or health care professional if you are unable to keep an appointment. What may interact with this medication? Do not take this medicine with any of the following medications: clozapine This medicine may also interact with the following medications: atazanavir cimetidine ciprofloxacin enoxacin fluvoxamine medicines for seizures like carbamazepine and phenobarbital mexiletine rifampin tacrine thiabendazole zileuton This list may not describe all possible interactions. Give your health care provider a list of all the medicines, herbs, non-prescription drugs, or dietary supplements you use. Also tell them if you smoke, drink alcohol, or use illegal drugs. Some items may interact with your medicine. What should I watch for while using this medication? This drug may make you feel generally unwell. This is not uncommon, as chemotherapy can affect healthy cells as well as cancer cells. Report any side effects. Continue your course of treatment even though you feel ill unless your doctor tells you to stop. You may need blood work done while you are taking this medicine. Call your doctor or healthcare provider for advice if you get a fever, chills or sore throat, or other symptoms of a cold or flu. Do not treat yourself. This drug decreases your body's ability to  fight infections. Try to avoid being around people who are sick. This medicine may cause serious skin reactions. They can happen weeks to months after starting the medicine. Contact your healthcare provider right away if you notice fevers or flu-like symptoms with a rash. The rash may be red or purple and then turn into blisters or peeling of the skin. Or, you might notice a red rash with swelling of the face, lips or lymph nodes in your neck or under your arms. In some patients, this medicine may cause a serious brain infection that may cause death. If you have any problems seeing, thinking, speaking, walking, or standing, tell your health care provider right away. If you cannot reach your health care provider, urgently seek other source of medical care. This medicine may increase your risk to bruise or bleed. Call your doctor or healthcare provider if you notice any unusual bleeding. Talk to your doctor about your risk of cancer. You may be more at risk for certain types of cancers if you take this medicine. This medicine may increase your risk of skin cancer. Check your skin for changes to moles or for new growths while taking this medicine. Call your health care provider if you notice any of these skin changes. Do not become pregnant while taking this medicine or for at least 6 months after stopping it. Women  should inform their doctor if they wish to become pregnant or think they might be pregnant. Men should not father a child while taking this medicine and for at least 3 months after stopping it. There is a potential for serious side effects to an unborn child. Talk to your healthcare provider or pharmacist for more information. Do not breast-feed an infant while taking this medicine or for at least 1 week after stopping it. This medicine may make it more difficult to father a child. You should talk with your doctor or healthcare provider if you are concerned about your fertility. What side effects  may I notice from receiving this medication? Side effects that you should report to your doctor or health care professional as soon as possible: allergic reactions like skin rash, itching or hives, swelling of the face, lips, or tongue low blood counts - this medicine may decrease the number of white blood cells, red blood cells and platelets. You may be at increased risk for infections and bleeding. rash, fever, and swollen lymph nodes redness, blistering, peeling, or loosening of the skin, including inside the mouth signs of infection like fever or chills, cough, sore throat, pain or difficulty passing urine signs of decreased platelets or bleeding like bruising, pinpoint red spots on the skin, black, tarry stools, blood in the urine signs of decreased red blood cells like being unusually weak or tired, fainting spells, lightheadedness signs and symptoms of kidney injury like trouble passing urine or change in the amount of urine signs and symptoms of liver injury like dark yellow or brown urine; general ill feeling or flu-like symptoms; light-colored stools; loss of appetite; nausea; right upper belly pain; unusually weak or tired; yellowing of the eyes or skin Side effects that usually do not require medical attention (report to your doctor or health care professional if they continue or are bothersome): constipation decreased appetite diarrhea headache mouth sores nausea, vomiting tiredness This list may not describe all possible side effects. Call your doctor for medical advice about side effects. You may report side effects to FDA at 1-800-FDA-1088. Where should I keep my medication? This drug is given in a hospital or clinic and will not be stored at home. NOTE: This sheet is a summary. It may not cover all possible information. If you have questions about this medicine, talk to your doctor, pharmacist, or health care provider.  2022 Elsevier/Gold Standard (2020-04-23 00:00:00)

## 2021-12-19 NOTE — Progress Notes (Signed)
Patient presents for treatment. RN assessment completed along with the following:  Labs/vitals reviewed - Yes, and within treatment parameters.  Labs from 12/17/21 Weight within 10% of previous measurement - Yes Informed consent completed and reflects current therapy/intent - Yes, on date 12/18/2021             Provider progress note reviewed - Patient not seen by provider today. Most recent note dated 12/17/21 reviewed. Treatment/Antibody/Supportive plan reviewed - Yes, and there are no adjustments needed for today's treatment. S&H and other orders reviewed - Yes, and there are no additional orders identified. Previous treatment date reviewed - Yes, and the appropriate amount of time has elapsed between treatments. Clinic Hand Off Received from - none   Patient to proceed with treatment.    24 HOUR CALLBACK 24 hour call back for 1st time Rituxamib and Bendeka.  Patient in infusion room today for 2nd Bendeka infusion. Pt reports that he has had no N/V/D not sign of reaction or side effects. Patient is eating well. Pt knows to call with any questions or problems

## 2021-12-22 ENCOUNTER — Ambulatory Visit (HOSPITAL_COMMUNITY)
Admission: RE | Admit: 2021-12-22 | Discharge: 2021-12-22 | Disposition: A | Discharge status: Home / Self Care | Payer: BC Managed Care – PPO | Source: Ambulatory Visit | Attending: Oncology | Admitting: Oncology

## 2021-12-22 ENCOUNTER — Other Ambulatory Visit: Payer: Self-pay

## 2021-12-22 DIAGNOSIS — C8515 Unspecified B-cell lymphoma, lymph nodes of inguinal region and lower limb: Secondary | ICD-10-CM | POA: Diagnosis not present

## 2021-12-22 DIAGNOSIS — N281 Cyst of kidney, acquired: Secondary | ICD-10-CM | POA: Diagnosis not present

## 2021-12-22 DIAGNOSIS — I2510 Atherosclerotic heart disease of native coronary artery without angina pectoris: Secondary | ICD-10-CM | POA: Diagnosis not present

## 2021-12-22 DIAGNOSIS — C8590 Non-Hodgkin lymphoma, unspecified, unspecified site: Secondary | ICD-10-CM | POA: Diagnosis not present

## 2021-12-22 DIAGNOSIS — K7689 Other specified diseases of liver: Secondary | ICD-10-CM | POA: Diagnosis not present

## 2021-12-22 LAB — GLUCOSE, CAPILLARY: Glucose-Capillary: 107 mg/dL — ABNORMAL HIGH (ref 70–99)

## 2021-12-22 MED ORDER — FLUDEOXYGLUCOSE F - 18 (FDG) INJECTION
9.0000 | Freq: Once | INTRAVENOUS | Status: AC
  Administered 2021-12-22: 8.4 via INTRAVENOUS

## 2021-12-23 ENCOUNTER — Encounter: Payer: Self-pay | Admitting: *Deleted

## 2021-12-23 NOTE — Progress Notes (Signed)
PATIENT NAVIGATOR PROGRESS NOTE  Name: Ethan Peach, MD Date: 12/23/2021 MRN: [id number]  DOB: 01-26-1961   Reason for visit:  F/U after first treatment  Comments:  Called patient after first treatment cycle. States that he feels great, no nausea, little fatigue, taking 25 mg diphenhydramine for sleep but sleeping well. Eating and drinking well. Reviewed infection prevention practices Reviewed upcoming appts  Verbalized understanding. Encouraged to call with any issues or questions    Time spent counseling/coordinating care: 30-45 minutes

## 2022-01-01 ENCOUNTER — Inpatient Hospital Stay: Payer: BC Managed Care – PPO | Admitting: Oncology

## 2022-01-01 ENCOUNTER — Inpatient Hospital Stay: Payer: BC Managed Care – PPO

## 2022-01-01 ENCOUNTER — Other Ambulatory Visit: Payer: Self-pay

## 2022-01-01 VITALS — BP 131/73 | HR 75 | Temp 98.1°F | Resp 19 | Ht 72.0 in | Wt 170.2 lb

## 2022-01-01 DIAGNOSIS — Z5111 Encounter for antineoplastic chemotherapy: Secondary | ICD-10-CM | POA: Diagnosis not present

## 2022-01-01 DIAGNOSIS — Z5112 Encounter for antineoplastic immunotherapy: Secondary | ICD-10-CM | POA: Diagnosis not present

## 2022-01-01 DIAGNOSIS — C8515 Unspecified B-cell lymphoma, lymph nodes of inguinal region and lower limb: Secondary | ICD-10-CM

## 2022-01-01 DIAGNOSIS — Z8546 Personal history of malignant neoplasm of prostate: Secondary | ICD-10-CM | POA: Diagnosis not present

## 2022-01-01 DIAGNOSIS — C8580 Other specified types of non-Hodgkin lymphoma, unspecified site: Secondary | ICD-10-CM

## 2022-01-01 DIAGNOSIS — D539 Nutritional anemia, unspecified: Secondary | ICD-10-CM | POA: Diagnosis not present

## 2022-01-01 DIAGNOSIS — L299 Pruritus, unspecified: Secondary | ICD-10-CM | POA: Diagnosis not present

## 2022-01-01 DIAGNOSIS — C8330 Diffuse large B-cell lymphoma, unspecified site: Secondary | ICD-10-CM | POA: Diagnosis not present

## 2022-01-01 DIAGNOSIS — R530 Neoplastic (malignant) related fatigue: Secondary | ICD-10-CM | POA: Diagnosis not present

## 2022-01-01 DIAGNOSIS — D75839 Thrombocytosis, unspecified: Secondary | ICD-10-CM | POA: Diagnosis not present

## 2022-01-01 LAB — CBC WITH DIFFERENTIAL (CANCER CENTER ONLY)
Abs Immature Granulocytes: 0.01 10*3/uL (ref 0.00–0.07)
Basophils Absolute: 0.1 10*3/uL (ref 0.0–0.1)
Basophils Relative: 2 %
Eosinophils Absolute: 0.1 10*3/uL (ref 0.0–0.5)
Eosinophils Relative: 3 %
HCT: 34.5 % — ABNORMAL LOW (ref 39.0–52.0)
Hemoglobin: 11.2 g/dL — ABNORMAL LOW (ref 13.0–17.0)
Immature Granulocytes: 0 %
Lymphocytes Relative: 12 %
Lymphs Abs: 0.5 10*3/uL — ABNORMAL LOW (ref 0.7–4.0)
MCH: 31.5 pg (ref 26.0–34.0)
MCHC: 32.5 g/dL (ref 30.0–36.0)
MCV: 97.2 fL (ref 80.0–100.0)
Monocytes Absolute: 0.7 10*3/uL (ref 0.1–1.0)
Monocytes Relative: 17 %
Neutro Abs: 2.7 10*3/uL (ref 1.7–7.7)
Neutrophils Relative %: 66 %
Platelet Count: 504 10*3/uL — ABNORMAL HIGH (ref 150–400)
RBC: 3.55 MIL/uL — ABNORMAL LOW (ref 4.22–5.81)
RDW: 14.6 % (ref 11.5–15.5)
WBC Count: 4 10*3/uL (ref 4.0–10.5)
nRBC: 0 % (ref 0.0–0.2)

## 2022-01-01 LAB — CMP (CANCER CENTER ONLY)
ALT: 13 U/L (ref 0–44)
AST: 19 U/L (ref 15–41)
Albumin: 4 g/dL (ref 3.5–5.0)
Alkaline Phosphatase: 60 U/L (ref 38–126)
Anion gap: 11 (ref 5–15)
BUN: 13 mg/dL (ref 8–23)
CO2: 24 mmol/L (ref 22–32)
Calcium: 9.1 mg/dL (ref 8.9–10.3)
Chloride: 104 mmol/L (ref 98–111)
Creatinine: 1.06 mg/dL (ref 0.61–1.24)
GFR, Estimated: >60 mL/min (ref >60)
Glucose, Bld: 93 mg/dL (ref 70–99)
Potassium: 4.4 mmol/L (ref 3.5–5.1)
Sodium: 139 mmol/L (ref 135–145)
Total Bilirubin: 0.8 mg/dL (ref 0.3–1.2)
Total Protein: 7.8 g/dL (ref 6.5–8.1)

## 2022-01-01 LAB — URIC ACID: Uric Acid, Serum: 6.3 mg/dL (ref 3.7–8.6)

## 2022-01-01 NOTE — Progress Notes (Signed)
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  He developed a rash and pruritus during the rituximab infusion.  The infusion was stopped and and he was treated with additional Benadryl and Pepcid.  The rash resolved.  He was able to complete the rituximab infusion.  He reports mild nausea for a few days following chemotherapy.  No emesis.  He did not take antiemetics.  He reports malaise over the week following chemotherapy.  This has improved.  Good appetite.  No change in the palpable lymph nodes.  Objective:  Vital signs in last 24 hours:  Blood pressure 131/73, pulse 75, temperature 98.1 F (36.7 C), temperature source Oral, resp. rate 19, height 6' (1.829 m), weight 170 lb 3.2 oz (77.2 kg), SpO2 100 %.    HEENT: No thrush or ulcers Lymphatics: Soft mobile 1/2-1 cm bilateral (right greater than left) axillary nodes, 1-2 cm right greater than left inguinal nodes Resp: Lungs clear bilaterally Cardio: Regular rate and rhythm GI: No hepatosplenomegaly, no mass, nontender Vascular: No leg edema Skin: No rash  Lab Results:  Lab Results  Component Value Date   WBC 4.0 01/01/2022   HGB 11.2 (L) 01/01/2022   HCT 34.5 (L) 01/01/2022   MCV 97.2 01/01/2022   PLT 504 (H) 01/01/2022   NEUTROABS 2.7 01/01/2022    CMP  Lab Results  Component Value Date   NA 139 01/01/2022   K 4.4 01/01/2022   CL 104 01/01/2022   CO2 24 01/01/2022   GLUCOSE 93 01/01/2022   BUN 13 01/01/2022   CREATININE 1.06 01/01/2022   CALCIUM 9.1 01/01/2022   PROT 7.8 01/01/2022   ALBUMIN 4.0 01/01/2022   AST 19 01/01/2022   ALT 13 01/01/2022   ALKPHOS 60 01/01/2022   BILITOT 0.8 01/01/2022   GFRNONAA >60 01/01/2022   GFRAA 101 09/25/2019     Medications: I have reviewed the patient's current medications.   Assessment/Plan: Low-grade B-cell non-Hodgkin's  lymphoma Palpable lymphadenopathy IgG kappa serum M spike with elevated free kappa light chain Bone marrow biopsy 12/05/2021-hypercellular marrow, 95%, diffusely infiltrated by B-cell lymphoma, no expression of CD5 or CD10, CD20 positive.  Differential diagnosis includes marginal zone lymphoma and lymphoplasmacytic lymphoma, flow cytometry identified a kappa restricted B-cell population compromising 80% of lymphocytes, 33 XY karyotype CTs 12/08/2021-lipoma to left lower neck/upper chest, hepatomegaly with numerous liver cysts, prominent right inguinal node, no splenomegaly, no suspicious bone lesions, 9 mm focus of hyperenhancement in the left hepatic lobe, small right adrenal nodule Cycle 1 Bendamustine/rituximab 12/18/2021  2.   Macrocytic anemia secondary to #1 3.   Mild thrombocytosis 4.   History of prostate cancer 5.   Malaise secondary to #1 6.   Pruritus/rash during rituximab infusion 12/18/2021-improved with additional Benadryl, Pepcid, completed rituximab infusion     Disposition: Dr. [name] is now at day 15 following cycle 1 Bendamustine/rituximab.  He tolerated the treatment well other than a rash during the rituximab infusion.  The rash resolved with Benadryl and Pepcid.  I will add Pepcid as a premedication and convert the Benadryl to IV with the next cycle of chemotherapy.  The palpable lymph nodes appear unchanged.  Hopefully the lymph nodes will improve over the next few weeks.  He will return for an office visit prior to chemotherapy on 01/15/2022.  [name], MD  01/01/2022  12:35 PM

## 2022-01-02 ENCOUNTER — Encounter: Payer: Self-pay | Admitting: *Deleted

## 2022-01-02 NOTE — Progress Notes (Signed)
Order placed for additional testing of Bone marrow biopsy (accession number [id number]) with pathology department °

## 2022-01-08 ENCOUNTER — Encounter: Payer: Self-pay | Admitting: *Deleted

## 2022-01-08 NOTE — Progress Notes (Signed)
Testing for MYD88 on accession number [id number] from Dr Bonnielee Haff bone marrow biopsy on 01/02/22 ?

## 2022-01-10 ENCOUNTER — Other Ambulatory Visit: Payer: Self-pay | Admitting: Oncology

## 2022-01-15 ENCOUNTER — Encounter: Payer: Self-pay | Admitting: Oncology

## 2022-01-15 ENCOUNTER — Other Ambulatory Visit: Payer: Self-pay

## 2022-01-15 ENCOUNTER — Encounter: Payer: Self-pay | Admitting: *Deleted

## 2022-01-15 ENCOUNTER — Inpatient Hospital Stay: Payer: BC Managed Care – PPO

## 2022-01-15 ENCOUNTER — Inpatient Hospital Stay: Payer: BC Managed Care – PPO | Attending: Oncology

## 2022-01-15 ENCOUNTER — Inpatient Hospital Stay: Payer: BC Managed Care – PPO | Admitting: Oncology

## 2022-01-15 VITALS — BP 129/76 | HR 70 | Temp 98.2°F | Resp 18

## 2022-01-15 VITALS — BP 135/80 | HR 68 | Temp 98.1°F | Resp 18 | Ht 72.0 in | Wt 169.4 lb

## 2022-01-15 DIAGNOSIS — T451X5A Adverse effect of antineoplastic and immunosuppressive drugs, initial encounter: Secondary | ICD-10-CM | POA: Insufficient documentation

## 2022-01-15 DIAGNOSIS — D75839 Thrombocytosis, unspecified: Secondary | ICD-10-CM | POA: Insufficient documentation

## 2022-01-15 DIAGNOSIS — C8330 Diffuse large B-cell lymphoma, unspecified site: Secondary | ICD-10-CM | POA: Insufficient documentation

## 2022-01-15 DIAGNOSIS — L298 Other pruritus: Secondary | ICD-10-CM | POA: Diagnosis not present

## 2022-01-15 DIAGNOSIS — C8580 Other specified types of non-Hodgkin lymphoma, unspecified site: Secondary | ICD-10-CM

## 2022-01-15 DIAGNOSIS — Z5111 Encounter for antineoplastic chemotherapy: Secondary | ICD-10-CM | POA: Diagnosis not present

## 2022-01-15 DIAGNOSIS — Z8042 Family history of malignant neoplasm of prostate: Secondary | ICD-10-CM | POA: Diagnosis not present

## 2022-01-15 DIAGNOSIS — Z79899 Other long term (current) drug therapy: Secondary | ICD-10-CM | POA: Diagnosis not present

## 2022-01-15 DIAGNOSIS — D539 Nutritional anemia, unspecified: Secondary | ICD-10-CM | POA: Diagnosis not present

## 2022-01-15 DIAGNOSIS — R5381 Other malaise: Secondary | ICD-10-CM | POA: Insufficient documentation

## 2022-01-15 DIAGNOSIS — Z5112 Encounter for antineoplastic immunotherapy: Secondary | ICD-10-CM | POA: Insufficient documentation

## 2022-01-15 LAB — CMP (CANCER CENTER ONLY)
ALT: 12 U/L (ref 0–44)
AST: 20 U/L (ref 15–41)
Albumin: 4.3 g/dL (ref 3.5–5.0)
Alkaline Phosphatase: 65 U/L (ref 38–126)
Anion gap: 4 — ABNORMAL LOW (ref 5–15)
BUN: 16 mg/dL (ref 8–23)
CO2: 30 mmol/L (ref 22–32)
Calcium: 9.1 mg/dL (ref 8.9–10.3)
Chloride: 104 mmol/L (ref 98–111)
Creatinine: 0.99 mg/dL (ref 0.61–1.24)
GFR, Estimated: >60 mL/min (ref >60)
Glucose, Bld: 89 mg/dL (ref 70–99)
Potassium: 4.3 mmol/L (ref 3.5–5.1)
Sodium: 138 mmol/L (ref 135–145)
Total Bilirubin: 1.1 mg/dL (ref 0.3–1.2)
Total Protein: 7.7 g/dL (ref 6.5–8.1)

## 2022-01-15 LAB — SURGICAL PATHOLOGY

## 2022-01-15 LAB — CBC WITH DIFFERENTIAL (CANCER CENTER ONLY)
Abs Immature Granulocytes: 0.01 10*3/uL (ref 0.00–0.07)
Basophils Absolute: 0.1 10*3/uL (ref 0.0–0.1)
Basophils Relative: 2 %
Eosinophils Absolute: 0.1 10*3/uL (ref 0.0–0.5)
Eosinophils Relative: 3 %
HCT: 38.3 % — ABNORMAL LOW (ref 39.0–52.0)
Hemoglobin: 12.6 g/dL — ABNORMAL LOW (ref 13.0–17.0)
Immature Granulocytes: 0 %
Lymphocytes Relative: 16 %
Lymphs Abs: 0.5 10*3/uL — ABNORMAL LOW (ref 0.7–4.0)
MCH: 31.5 pg (ref 26.0–34.0)
MCHC: 32.9 g/dL (ref 30.0–36.0)
MCV: 95.8 fL (ref 80.0–100.0)
Monocytes Absolute: 0.7 10*3/uL (ref 0.1–1.0)
Monocytes Relative: 23 %
Neutro Abs: 1.7 10*3/uL (ref 1.7–7.7)
Neutrophils Relative %: 56 %
Platelet Count: 237 10*3/uL (ref 150–400)
RBC: 4 MIL/uL — ABNORMAL LOW (ref 4.22–5.81)
RDW: 14.2 % (ref 11.5–15.5)
WBC Count: 3 10*3/uL — ABNORMAL LOW (ref 4.0–10.5)
nRBC: 0 % (ref 0.0–0.2)

## 2022-01-15 LAB — LACTATE DEHYDROGENASE: LDH: 170 U/L (ref 98–192)

## 2022-01-15 MED ORDER — SODIUM CHLORIDE 0.9 % IV SOLN: Freq: Once | INTRAVENOUS | Status: AC

## 2022-01-15 MED ORDER — ACETAMINOPHEN 325 MG PO TABS
650.0000 mg | ORAL_TABLET | Freq: Once | ORAL | Status: AC
  Administered 2022-01-15: 10:00:00 650 mg via ORAL
  Filled 2022-01-15: qty 2

## 2022-01-15 MED ORDER — PALONOSETRON HCL INJECTION 0.25 MG/5ML
0.2500 mg | Freq: Once | INTRAVENOUS | Status: AC
  Administered 2022-01-15: 09:00:00 0.25 mg via INTRAVENOUS
  Filled 2022-01-15: qty 5

## 2022-01-15 MED ORDER — DIPHENHYDRAMINE HCL 50 MG/ML IJ SOLN
25.0000 mg | Freq: Once | INTRAMUSCULAR | Status: AC
  Administered 2022-01-15: 10:00:00 25 mg via INTRAVENOUS
  Filled 2022-01-15: qty 1

## 2022-01-15 MED ORDER — SODIUM CHLORIDE 0.9 % IV SOLN
70.0000 mg/m2 | Freq: Once | INTRAVENOUS | Status: AC
  Administered 2022-01-15: 16:00:00 150 mg via INTRAVENOUS
  Filled 2022-01-15: qty 6

## 2022-01-15 MED ORDER — METHYLPREDNISOLONE SODIUM SUCC 125 MG IJ SOLR
125.0000 mg | Freq: Once | INTRAMUSCULAR | Status: AC
  Administered 2022-01-15: 10:00:00 125 mg via INTRAVENOUS
  Filled 2022-01-15: qty 2

## 2022-01-15 MED ORDER — DIPHENHYDRAMINE HCL 50 MG/ML IJ SOLN
25.0000 mg | Freq: Once | INTRAMUSCULAR | Status: AC
  Administered 2022-01-15: 13:00:00 25 mg via INTRAVENOUS

## 2022-01-15 MED ORDER — SODIUM CHLORIDE 0.9 % IV SOLN: Freq: Once | INTRAVENOUS | Status: DC | PRN

## 2022-01-15 MED ORDER — FAMOTIDINE IN NACL 20-0.9 MG/50ML-% IV SOLN
20.0000 mg | Freq: Once | INTRAVENOUS | Status: AC
  Administered 2022-01-15: 10:00:00 20 mg via INTRAVENOUS
  Filled 2022-01-15: qty 50

## 2022-01-15 MED ORDER — SODIUM CHLORIDE 0.9 % IV SOLN
375.0000 mg/m2 | Freq: Once | INTRAVENOUS | Status: AC
  Administered 2022-01-15: 11:00:00 800 mg via INTRAVENOUS
  Filled 2022-01-15: qty 50

## 2022-01-15 NOTE — Patient Instructions (Signed)
[facility]   Discharge Instructions: Thank you for choosing [facility] to provide your oncology and hematology care.   If you have a lab appointment with the [facility], please go directly to the [facility] and check in at the registration area.   Wear comfortable clothing and clothing appropriate for easy access to any Portacath or PICC line.   We strive to give you quality time with your provider. You may need to reschedule your appointment if you arrive late (15 or more minutes).  Arriving late affects you and other patients whose appointments are after yours.  Also, if you miss three or more appointments without notifying the office, you may be dismissed from the clinic at the providers discretion.      For prescription refill requests, have your pharmacy contact our office and allow 72 hours for refills to be completed.    Today you received the following chemotherapy and/or immunotherapy agents Rituximab, Bendeka      To help prevent nausea and vomiting after your treatment, we encourage you to take your nausea medication as directed.  BELOW ARE SYMPTOMS THAT SHOULD BE REPORTED IMMEDIATELY: *FEVER GREATER THAN 100.4 F (38 C) OR HIGHER *CHILLS OR SWEATING *NAUSEA AND VOMITING THAT IS NOT CONTROLLED WITH YOUR NAUSEA MEDICATION *UNUSUAL SHORTNESS OF BREATH *UNUSUAL BRUISING OR BLEEDING *URINARY PROBLEMS (pain or burning when urinating, or frequent urination) *BOWEL PROBLEMS (unusual diarrhea, constipation, pain near the anus) TENDERNESS IN MOUTH AND THROAT WITH OR WITHOUT PRESENCE OF ULCERS (sore throat, sores in mouth, or a toothache) UNUSUAL RASH, SWELLING OR PAIN  UNUSUAL VAGINAL DISCHARGE OR ITCHING   Items with * indicate a potential emergency and should be followed up as soon as possible or go to the Emergency Department if any problems should occur.  Please show the CHEMOTHERAPY ALERT CARD or IMMUNOTHERAPY ALERT CARD at  check-in to the Emergency Department and triage nurse.  Should you have questions after your visit or need to cancel or reschedule your appointment, please contact [facility]  Dept: [phone]  and follow the prompts.  Office hours are 8:00 a.m. to 4:30 p.m. Monday - Friday. Please note that voicemails left after 4:00 p.m. may not be returned until the following business day.  We are closed weekends and major holidays. You have access to a nurse at all times for urgent questions. Please call the main number to the clinic Dept: [phone] and follow the prompts.   For any non-urgent questions, you may also contact your provider using MyChart. We now offer e-Visits for anyone 18 and older to request care online for non-urgent symptoms. For details visit [email or web address].   Also download the MyChart app! Go to the app store, search "MyChart", open the app, select [facility], and log in with your MyChart username and password.  Due to Covid, a mask is required upon entering the hospital/clinic. If you do not have a mask, one will be given to you upon arrival. For doctor visits, patients may have 1 support person aged 66 or older with them. For treatment visits, patients cannot have anyone with them due to current Covid guidelines and our immunocompromised population.   Rituximab Injection What is this medication? RITUXIMAB (ri TUX i mab) is a monoclonal antibody. It is used to treat certain types of cancer like non-Hodgkin lymphoma and chronic lymphocytic leukemia. It is also used to treat rheumatoid arthritis, granulomatosis with polyangiitis, microscopic polyangiitis, and pemphigus vulgaris.  This medicine may be used for other purposes; ask your health care provider or pharmacist if you have questions. COMMON BRAND NAME(S): RIABNI, Rituxan, RUXIENCE What should I tell my care team before I take this medication? They need to know if you have any of these  conditions: chest pain heart disease infection especially a viral infection such as chickenpox, cold sores, hepatitis B, or herpes immune system problems irregular heartbeat or rhythm kidney disease low blood counts (white cells, platelets, or red cells) lung disease recent or upcoming vaccine an unusual or allergic reaction to rituximab, other medicines, foods, dyes, or preservatives pregnant or trying to get pregnant breast-feeding How should I use this medication? This medicine is injected into a vein. It is given by a health care provider in a hospital or clinic setting. A special MedGuide will be given to you before each treatment. Be sure to read this information carefully each time. Talk to your health care provider about the use of this medicine in children. While this drug may be prescribed for children as young as 6 months for selected conditions, precautions do apply. Overdosage: If you think you have taken too much of this medicine contact a poison control center or emergency room at once. NOTE: This medicine is only for you. Do not share this medicine with others. What if I miss a dose? Keep appointments for follow-up doses. It is important not to miss your dose. Call your health care provider if you are unable to keep an appointment. What may interact with this medication? Do not take this medicine with any of the following medicines: live vaccines This medicine may also interact with the following medicines: cisplatin This list may not describe all possible interactions. Give your health care provider a list of all the medicines, herbs, non-prescription drugs, or dietary supplements you use. Also tell them if you smoke, drink alcohol, or use illegal drugs. Some items may interact with your medicine. What should I watch for while using this medication? Your condition will be monitored carefully while you are receiving this medicine. You may need blood work done while you are  taking this medicine. This medicine can cause serious infusion reactions. To reduce the risk your health care provider may give you other medicines to take before receiving this one. Be sure to follow the directions from your health care provider. This medicine may increase your risk of getting an infection. Call your health care provider for advice if you get a fever, chills, sore throat, or other symptoms of a cold or flu. Do not treat yourself. Try to avoid being around people who are sick. Call your health care provider if you are around anyone with measles, chickenpox, or if you develop sores or blisters that do not heal properly. Avoid taking medicines that contain aspirin, acetaminophen, ibuprofen, naproxen, or ketoprofen unless instructed by your health care provider. These medicines may hide a fever. This medicine may cause serious skin reactions. They can happen weeks to months after starting the medicine. Contact your health care provider right away if you notice fevers or flu-like symptoms with a rash. The rash may be red or purple and then turn into blisters or peeling of the skin. Or, you might notice a red rash with swelling of the face, lips or lymph nodes in your neck or under your arms. In some patients, this medicine may cause a serious brain infection that may cause death. If you have any problems seeing, thinking, speaking, walking, or standing,   tell your healthcare professional right away. If you cannot reach your healthcare professional, urgently seek other source of medical care. Do not become pregnant while taking this medicine or for at least 12 months after stopping it. Women should inform their health care provider if they wish to become pregnant or think they might be pregnant. There is potential for serious harm to an unborn child. Talk to your health care provider for more information. Women should use a reliable form of birth control while taking this medicine and for 12 months  after stopping it. Do not breast-feed while taking this medicine or for at least 6 months after stopping it. What side effects may I notice from receiving this medication? Side effects that you should report to your health care provider as soon as possible: allergic reactions (skin rash, itching or hives; swelling of the face, lips, or tongue) diarrhea edema (sudden weight gain; swelling of the ankles, feet, hands or other unusual swelling; trouble breathing) fast, irregular heartbeat heart attack (trouble breathing; pain or tightness in the chest, neck, back or arms; unusually weak or tired) infection (fever, chills, cough, sore throat, pain or trouble passing urine) kidney injury (trouble passing urine or change in the amount of urine) liver injury (dark yellow or brown urine; general ill feeling or flu-like symptoms; loss of appetite, right upper belly pain; unusually weak or tired, yellowing of the eyes or skin) low blood pressure (dizziness; feeling faint or lightheaded, falls; unusually weak or tired) low red blood cell counts (trouble breathing; feeling faint; lightheaded, falls; unusually weak or tired) mouth sores redness, blistering, peeling, or loosening of the skin, including inside the mouth stomach pain unusual bruising or bleeding wheezing (trouble breathing with loud or whistling sounds) vomiting Side effects that usually do not require medical attention (report to your health care provider if they continue or are bothersome): headache joint pain muscle cramps, pain nausea This list may not describe all possible side effects. Call your doctor for medical advice about side effects. You may report side effects to FDA at 1-800-FDA-1088. Where should I keep my medication? This medicine is given in a hospital or clinic. It will not be stored at home. NOTE: This sheet is a summary. It may not cover all possible information. If you have questions about this medicine, talk to your  doctor, pharmacist, or health care provider.  2022 Elsevier/Gold Standard (2020-10-28 00:00:00)  Bendamustine Injection What is this medication? BENDAMUSTINE (BEN da MUS teen) is a chemotherapy drug. It is used to treat chronic lymphocytic leukemia and non-Hodgkin lymphoma. This medicine may be used for other purposes; ask your health care provider or pharmacist if you have questions. COMMON BRAND NAME(S): Kristine Royal, Treanda What should I tell my care team before I take this medication? They need to know if you have any of these conditions: infection (especially a virus infection such as chickenpox, cold sores, or herpes) kidney disease liver disease an unusual or allergic reaction to bendamustine, mannitol, other medicines, foods, dyes, or preservatives pregnant or trying to get pregnant breast-feeding How should I use this medication? This medicine is for infusion into a vein. It is given by a health care professional in a hospital or clinic setting. Talk to your pediatrician regarding the use of this medicine in children. Special care may be needed. Overdosage: If you think you have taken too much of this medicine contact a poison control center or emergency room at once. NOTE: This medicine is only for you. Do not  share this medicine with others. What if I miss a dose? It is important not to miss your dose. Call your doctor or health care professional if you are unable to keep an appointment. What may interact with this medication? Do not take this medicine with any of the following medications: clozapine This medicine may also interact with the following medications: atazanavir cimetidine ciprofloxacin enoxacin fluvoxamine medicines for seizures like carbamazepine and phenobarbital mexiletine rifampin tacrine thiabendazole zileuton This list may not describe all possible interactions. Give your health care provider a list of all the medicines, herbs,  non-prescription drugs, or dietary supplements you use. Also tell them if you smoke, drink alcohol, or use illegal drugs. Some items may interact with your medicine. What should I watch for while using this medication? This drug may make you feel generally unwell. This is not uncommon, as chemotherapy can affect healthy cells as well as cancer cells. Report any side effects. Continue your course of treatment even though you feel ill unless your doctor tells you to stop. You may need blood work done while you are taking this medicine. Call your doctor or healthcare provider for advice if you get a fever, chills or sore throat, or other symptoms of a cold or flu. Do not treat yourself. This drug decreases your body's ability to fight infections. Try to avoid being around people who are sick. This medicine may cause serious skin reactions. They can happen weeks to months after starting the medicine. Contact your healthcare provider right away if you notice fevers or flu-like symptoms with a rash. The rash may be red or purple and then turn into blisters or peeling of the skin. Or, you might notice a red rash with swelling of the face, lips or lymph nodes in your neck or under your arms. In some patients, this medicine may cause a serious brain infection that may cause death. If you have any problems seeing, thinking, speaking, walking, or standing, tell your health care provider right away. If you cannot reach your health care provider, urgently seek other source of medical care. This medicine may increase your risk to bruise or bleed. Call your doctor or healthcare provider if you notice any unusual bleeding. Talk to your doctor about your risk of cancer. You may be more at risk for certain types of cancers if you take this medicine. This medicine may increase your risk of skin cancer. Check your skin for changes to moles or for new growths while taking this medicine. Call your health care provider if you  notice any of these skin changes. Do not become pregnant while taking this medicine or for at least 6 months after stopping it. Women should inform their doctor if they wish to become pregnant or think they might be pregnant. Men should not father a child while taking this medicine and for at least 3 months after stopping it. There is a potential for serious side effects to an unborn child. Talk to your healthcare provider or pharmacist for more information. Do not breast-feed an infant while taking this medicine or for at least 1 week after stopping it. This medicine may make it more difficult to father a child. You should talk with your doctor or healthcare provider if you are concerned about your fertility. What side effects may I notice from receiving this medication? Side effects that you should report to your doctor or health care professional as soon as possible: allergic reactions like skin rash, itching or hives, swelling of  the face, lips, or tongue low blood counts - this medicine may decrease the number of white blood cells, red blood cells and platelets. You may be at increased risk for infections and bleeding. rash, fever, and swollen lymph nodes redness, blistering, peeling, or loosening of the skin, including inside the mouth signs of infection like fever or chills, cough, sore throat, pain or difficulty passing urine signs of decreased platelets or bleeding like bruising, pinpoint red spots on the skin, black, tarry stools, blood in the urine signs of decreased red blood cells like being unusually weak or tired, fainting spells, lightheadedness signs and symptoms of kidney injury like trouble passing urine or change in the amount of urine signs and symptoms of liver injury like dark yellow or brown urine; general ill feeling or flu-like symptoms; light-colored stools; loss of appetite; nausea; right upper belly pain; unusually weak or tired; yellowing of the eyes or skin Side effects  that usually do not require medical attention (report to your doctor or health care professional if they continue or are bothersome): constipation decreased appetite diarrhea headache mouth sores nausea, vomiting tiredness This list may not describe all possible side effects. Call your doctor for medical advice about side effects. You may report side effects to FDA at 1-800-FDA-1088. Where should I keep my medication? This drug is given in a hospital or clinic and will not be stored at home. NOTE: This sheet is a summary. It may not cover all possible information. If you have questions about this medicine, talk to your doctor, pharmacist, or health care provider.  2022 Elsevier/Gold Standard (2020-04-23 00:00:00)

## 2022-01-15 NOTE — Progress Notes (Signed)
Hypersensitivity Reaction note ? ?Date of event: 01/15/22 ?Time of event: 1314 ?Generic name of drug involved: rituximab ?Name of provider notified of the hypersensitivity reaction: Dr. [name]; Ned Card, NP ?Was agent that likely caused hypersensitivity reaction added to Allergies List within EMR? Yes, added after initial reaction on 12/18/21, updated during this visit.  ?Chain of events including reaction signs/symptoms, treatment administered, and outcome (e.g., drug resumed; drug discontinued; sent to Emergency Department; etc.) ?Patient stated he "felt crummy", chest tightness, and itchiness on scalp.  Patient denied SOB.  Rituximab infusion stopped and normal saline started. Red patches observed on face and on abdomen.  VSS, BP slightly elevated. Dr. [name] notified. Verbal order received for IV benadryl (see MAR).  Ned Card, NP in infusion room to assess patient.   ?Patient monitored for 30 minutes and all symptoms improved. Infusion restarted at 1405 (see MAR). Dr. [name] in infusion room to assess patient.  ?Patient able to tolerate remainder of infusion and treatment without any issues. VSS upon leaving infusion room.  ? ?Ethan Spray, RN ?01/15/2022 1:30 PM ? ?

## 2022-01-15 NOTE — Progress Notes (Signed)
Patient presents for treatment. RN assessment completed along with the following: ? ?Labs/vitals reviewed - Yes, and within treatment parameters.   ?Weight within 10% of previous measurement - Yes ?Informed consent completed and reflects current therapy/intent - Yes, on date 12/18/21             ?Provider progress note reviewed - Yes, today's provider note was reviewed. ?Treatment/Antibody/Supportive plan reviewed - Yes, and Premeds added due to patient having a reaction during his first treatment.  Titration will be done as a first time titration.  ?S&H and other orders reviewed - Yes, and there are no additional orders identified. ?Previous treatment date reviewed - Yes, and the appropriate amount of time has elapsed between treatments. ?Clinic Hand Off Received from - Merceda Elks, RN ? ?Patient to proceed with treatment.   ?

## 2022-01-15 NOTE — Progress Notes (Signed)
?  [facility] ?OFFICE PROGRESS NOTE ? ? ?Diagnosis: Non-Hodgkin's and Phoma ? ?INTERVAL HISTORY:  ? ?Ethan Powers returns as scheduled.  He reports mild decrease in the palpable lymph nodes.  He is exercising and playing golf.  He had malaise over the first 2 weeks following chemotherapy.  This has improved. ? ?Objective: ? ?Vital signs in last 24 hours: ? ?Blood pressure 135/80, pulse 68, temperature 98.1 ?F (36.7 ?C), temperature source Oral, resp. rate 18, height 6' (1.829 m), weight 169 lb 6.4 oz (76.8 kg), SpO2 100 %. ?  ? ?HEENT: No thrush or ulcers ?Lymphatics: No cervical or supraclavicular nodes, less than 1 cm mobile bilateral axillary nodes, "shotty "and less than 1 cm bilateral inguinal nodes ?Resp: Lungs clear bilaterally ?Cardio: Regular rate and rhythm ?GI: No hepatosplenomegaly ?Vascular: No leg edema ?  ? ?Lab Results: ? ?Lab Results  ?Component Value Date  ? WBC 3.0 (L) 01/15/2022  ? HGB 12.6 (L) 01/15/2022  ? HCT 38.3 (L) 01/15/2022  ? MCV 95.8 01/15/2022  ? PLT 237 01/15/2022  ? NEUTROABS 1.7 01/15/2022  ? ? ?CMP  ?Lab Results  ?Component Value Date  ? NA 138 01/15/2022  ? K 4.3 01/15/2022  ? CL 104 01/15/2022  ? CO2 30 01/15/2022  ? GLUCOSE 89 01/15/2022  ? BUN 16 01/15/2022  ? CREATININE 0.99 01/15/2022  ? CALCIUM 9.1 01/15/2022  ? PROT 7.7 01/15/2022  ? ALBUMIN 4.3 01/15/2022  ? AST 20 01/15/2022  ? ALT 12 01/15/2022  ? ALKPHOS 65 01/15/2022  ? BILITOT 1.1 01/15/2022  ? GFRNONAA >60 01/15/2022  ? GFRAA 101 09/25/2019  ? ? ?Medications: I have reviewed the patient's current medications. ? ? ?Assessment/Plan: ?Low-grade B-cell non-Hodgkin's lymphoma ?Palpable lymphadenopathy ?IgG kappa serum M spike with elevated free kappa light chain ?Bone marrow biopsy 12/05/2021-hypercellular marrow, 95%, diffusely infiltrated by B-cell lymphoma, no expression of CD5 or CD10, CD20 positive.  Differential diagnosis includes marginal zone lymphoma and lymphoplasmacytic lymphoma, flow cytometry  identified a kappa restricted B-cell population compromising 80% of lymphocytes, 71 XY karyotype ?CTs 12/08/2021-lipoma to left lower neck/upper chest, hepatomegaly with numerous liver cysts, prominent right inguinal node, no splenomegaly, no suspicious bone lesions, 9 mm focus of hyperenhancement in the left hepatic lobe, small right adrenal nodule ?Cycle 1 Bendamustine/rituximab 12/18/2021 ?Cycle 2 Bendamustine/rituximab 01/15/2022 ? ?2.   Macrocytic anemia secondary to #1 ?3.   Mild thrombocytosis ?4.   History of prostate cancer ?5.   Malaise secondary to #1 ?6.   Pruritus/rash during rituximab infusion 12/18/2021-improved with additional Benadryl, Pepcid, completed rituximab infusion ? ? ? ? ? ?Disposition: ?Ethan Powers appears well.  He tolerated the first cycle of Bendamustine/rituximab well aside from a rash during the rituximab infusion.  He will receive Pepcid and Solu-Medrol as part of the premedication regimen today. ? ?His energy level and hemoglobin are improved.  We will check a serum protein electrophoresis, IgG level, and beta-2 microglobulin when he returns in 2-3 weeks.  He has persistent small palpable lymph nodes. ? ?[name], MD ? ?01/15/2022  ?8:50 AM ? ? ?

## 2022-01-15 NOTE — Progress Notes (Signed)
Patient seen by Dr. [name] today ? ?Vitals are within treatment parameters. ? ?Labs reviewed by Dr. [name] and are within treatment parameters. ? ?Per physician team, patient is ready for treatment and there are NO modifications to the treatment plan.  ?

## 2022-01-16 ENCOUNTER — Encounter (HOSPITAL_COMMUNITY): Payer: Self-pay | Admitting: Oncology

## 2022-01-16 ENCOUNTER — Telehealth: Payer: Self-pay | Admitting: *Deleted

## 2022-01-16 ENCOUNTER — Inpatient Hospital Stay: Payer: BC Managed Care – PPO

## 2022-01-16 VITALS — BP 121/73 | HR 79 | Temp 97.9°F | Resp 18

## 2022-01-16 DIAGNOSIS — T451X5A Adverse effect of antineoplastic and immunosuppressive drugs, initial encounter: Secondary | ICD-10-CM | POA: Diagnosis not present

## 2022-01-16 DIAGNOSIS — C8330 Diffuse large B-cell lymphoma, unspecified site: Secondary | ICD-10-CM | POA: Diagnosis not present

## 2022-01-16 DIAGNOSIS — Z5111 Encounter for antineoplastic chemotherapy: Secondary | ICD-10-CM | POA: Diagnosis not present

## 2022-01-16 DIAGNOSIS — D75839 Thrombocytosis, unspecified: Secondary | ICD-10-CM | POA: Diagnosis not present

## 2022-01-16 DIAGNOSIS — Z79899 Other long term (current) drug therapy: Secondary | ICD-10-CM | POA: Diagnosis not present

## 2022-01-16 DIAGNOSIS — Z8042 Family history of malignant neoplasm of prostate: Secondary | ICD-10-CM | POA: Diagnosis not present

## 2022-01-16 DIAGNOSIS — C8580 Other specified types of non-Hodgkin lymphoma, unspecified site: Secondary | ICD-10-CM

## 2022-01-16 DIAGNOSIS — L298 Other pruritus: Secondary | ICD-10-CM | POA: Diagnosis not present

## 2022-01-16 DIAGNOSIS — Z5112 Encounter for antineoplastic immunotherapy: Secondary | ICD-10-CM | POA: Diagnosis not present

## 2022-01-16 DIAGNOSIS — R5381 Other malaise: Secondary | ICD-10-CM | POA: Diagnosis not present

## 2022-01-16 DIAGNOSIS — D539 Nutritional anemia, unspecified: Secondary | ICD-10-CM | POA: Diagnosis not present

## 2022-01-16 MED ORDER — SODIUM CHLORIDE 0.9 % IV SOLN
10.0000 mg | Freq: Once | INTRAVENOUS | Status: AC
  Administered 2022-01-16: 10 mg via INTRAVENOUS
  Filled 2022-01-16: qty 1

## 2022-01-16 MED ORDER — SODIUM CHLORIDE 0.9 % IV SOLN: Freq: Once | INTRAVENOUS | Status: AC

## 2022-01-16 MED ORDER — SODIUM CHLORIDE 0.9 % IV SOLN
70.0000 mg/m2 | Freq: Once | INTRAVENOUS | Status: AC
  Administered 2022-01-16: 150 mg via INTRAVENOUS
  Filled 2022-01-16: qty 6

## 2022-01-16 NOTE — Patient Instructions (Signed)
[facility]  Discharge Instructions: Thank you for choosing [facility] to provide your oncology and hematology care.   If you have a lab appointment with the [facility], please go directly to the [facility] and check in at the registration area.   Wear comfortable clothing and clothing appropriate for easy access to any Portacath or PICC line.   We strive to give you quality time with your provider. You may need to reschedule your appointment if you arrive late (15 or more minutes).  Arriving late affects you and other patients whose appointments are after yours.  Also, if you miss three or more appointments without notifying the office, you may be dismissed from the clinic at the providers discretion.      For prescription refill requests, have your pharmacy contact our office and allow 72 hours for refills to be completed.    Today you received the following chemotherapy and/or immunotherapy agents: bendamustine  Bendamustine Injection What is this medication? BENDAMUSTINE (BEN da MUS teen) is a chemotherapy drug. It is used to treat chronic lymphocytic leukemia and non-Hodgkin lymphoma. This medicine may be used for other purposes; ask your health care provider or pharmacist if you have questions. COMMON BRAND NAME(S): Kristine Royal, Treanda What should I tell my care team before I take this medication? They need to know if you have any of these conditions: infection (especially a virus infection such as chickenpox, cold sores, or herpes) kidney disease liver disease an unusual or allergic reaction to bendamustine, mannitol, other medicines, foods, dyes, or preservatives pregnant or trying to get pregnant breast-feeding How should I use this medication? This medicine is for infusion into a vein. It is given by a health care professional in a hospital or clinic setting. Talk to your pediatrician regarding the use of this medicine in  children. Special care may be needed. Overdosage: If you think you have taken too much of this medicine contact a poison control center or emergency room at once. NOTE: This medicine is only for you. Do not share this medicine with others. What if I miss a dose? It is important not to miss your dose. Call your doctor or health care professional if you are unable to keep an appointment. What may interact with this medication? Do not take this medicine with any of the following medications: clozapine This medicine may also interact with the following medications: atazanavir cimetidine ciprofloxacin enoxacin fluvoxamine medicines for seizures like carbamazepine and phenobarbital mexiletine rifampin tacrine thiabendazole zileuton This list may not describe all possible interactions. Give your health care provider a list of all the medicines, herbs, non-prescription drugs, or dietary supplements you use. Also tell them if you smoke, drink alcohol, or use illegal drugs. Some items may interact with your medicine. What should I watch for while using this medication? This drug may make you feel generally unwell. This is not uncommon, as chemotherapy can affect healthy cells as well as cancer cells. Report any side effects. Continue your course of treatment even though you feel ill unless your doctor tells you to stop. You may need blood work done while you are taking this medicine. Call your doctor or healthcare provider for advice if you get a fever, chills or sore throat, or other symptoms of a cold or flu. Do not treat yourself. This drug decreases your body's ability to fight infections. Try to avoid being around people who are sick. This medicine may cause serious skin reactions.  They can happen weeks to months after starting the medicine. Contact your healthcare provider right away if you notice fevers or flu-like symptoms with a rash. The rash may be red or purple and then turn into blisters  or peeling of the skin. Or, you might notice a red rash with swelling of the face, lips or lymph nodes in your neck or under your arms. In some patients, this medicine may cause a serious brain infection that may cause death. If you have any problems seeing, thinking, speaking, walking, or standing, tell your health care provider right away. If you cannot reach your health care provider, urgently seek other source of medical care. This medicine may increase your risk to bruise or bleed. Call your doctor or healthcare provider if you notice any unusual bleeding. Talk to your doctor about your risk of cancer. You may be more at risk for certain types of cancers if you take this medicine. This medicine may increase your risk of skin cancer. Check your skin for changes to moles or for new growths while taking this medicine. Call your health care provider if you notice any of these skin changes. Do not become pregnant while taking this medicine or for at least 6 months after stopping it. Women should inform their doctor if they wish to become pregnant or think they might be pregnant. Men should not father a child while taking this medicine and for at least 3 months after stopping it. There is a potential for serious side effects to an unborn child. Talk to your healthcare provider or pharmacist for more information. Do not breast-feed an infant while taking this medicine or for at least 1 week after stopping it. This medicine may make it more difficult to father a child. You should talk with your doctor or healthcare provider if you are concerned about your fertility. What side effects may I notice from receiving this medication? Side effects that you should report to your doctor or health care professional as soon as possible: allergic reactions like skin rash, itching or hives, swelling of the face, lips, or tongue low blood counts - this medicine may decrease the number of white blood cells, red blood cells  and platelets. You may be at increased risk for infections and bleeding. rash, fever, and swollen lymph nodes redness, blistering, peeling, or loosening of the skin, including inside the mouth signs of infection like fever or chills, cough, sore throat, pain or difficulty passing urine signs of decreased platelets or bleeding like bruising, pinpoint red spots on the skin, black, tarry stools, blood in the urine signs of decreased red blood cells like being unusually weak or tired, fainting spells, lightheadedness signs and symptoms of kidney injury like trouble passing urine or change in the amount of urine signs and symptoms of liver injury like dark yellow or brown urine; general ill feeling or flu-like symptoms; light-colored stools; loss of appetite; nausea; right upper belly pain; unusually weak or tired; yellowing of the eyes or skin Side effects that usually do not require medical attention (report to your doctor or health care professional if they continue or are bothersome): constipation decreased appetite diarrhea headache mouth sores nausea, vomiting tiredness This list may not describe all possible side effects. Call your doctor for medical advice about side effects. You may report side effects to FDA at 1-800-FDA-1088. Where should I keep my medication? This drug is given in a hospital or clinic and will not be stored at home. NOTE: This sheet  is a summary. It may not cover all possible information. If you have questions about this medicine, talk to your doctor, pharmacist, or health care provider.  2022 Elsevier/Gold Standard (2020-04-23 00:00:00)       To help prevent nausea and vomiting after your treatment, we encourage you to take your nausea medication as directed.  BELOW ARE SYMPTOMS THAT SHOULD BE REPORTED IMMEDIATELY: *FEVER GREATER THAN 100.4 F (38 C) OR HIGHER *CHILLS OR SWEATING *NAUSEA AND VOMITING THAT IS NOT CONTROLLED WITH YOUR NAUSEA MEDICATION *UNUSUAL  SHORTNESS OF BREATH *UNUSUAL BRUISING OR BLEEDING *URINARY PROBLEMS (pain or burning when urinating, or frequent urination) *BOWEL PROBLEMS (unusual diarrhea, constipation, pain near the anus) TENDERNESS IN MOUTH AND THROAT WITH OR WITHOUT PRESENCE OF ULCERS (sore throat, sores in mouth, or a toothache) UNUSUAL RASH, SWELLING OR PAIN  UNUSUAL VAGINAL DISCHARGE OR ITCHING   Items with * indicate a potential emergency and should be followed up as soon as possible or go to the Emergency Department if any problems should occur.  Please show the CHEMOTHERAPY ALERT CARD or IMMUNOTHERAPY ALERT CARD at check-in to the Emergency Department and triage nurse.  Should you have questions after your visit or need to cancel or reschedule your appointment, please contact [facility]  Dept: [phone]  and follow the prompts.  Office hours are 8:00 a.m. to 4:30 p.m. Monday - Friday. Please note that voicemails left after 4:00 p.m. may not be returned until the following business day.  We are closed weekends and major holidays. You have access to a nurse at all times for urgent questions. Please call the main number to the clinic Dept: [phone] and follow the prompts.   For any non-urgent questions, you may also contact your provider using MyChart. We now offer e-Visits for anyone 12 and older to request care online for non-urgent symptoms. For details visit [email or web address].   Also download the MyChart app! Go to the app store, search "MyChart", open the app, select [facility], and log in with your MyChart username and password.  Due to Covid, a mask is required upon entering the hospital/clinic. If you do not have a mask, one will be given to you upon arrival. For doctor visits, patients may have 1 support person aged 38 or older with them. For treatment visits, patients cannot have anyone with them due to current Covid guidelines and our immunocompromised population.

## 2022-01-16 NOTE — Progress Notes (Signed)
Patient presents for treatment. RN assessment completed along with the following: ? ?Labs/vitals reviewed - Yes, and within treatment parameters.   ?Weight within 10% of previous measurement - Yes ?Informed consent completed and reflects current therapy/intent - Yes, on date 12/18/21             ?Provider progress note reviewed - Patient not seen by provider today. Most recent note dated 01/15/22 reviewed. ?Treatment/Antibody/Supportive plan reviewed - Yes, and there are no adjustments needed for today's treatment. ?S&H and other orders reviewed - Yes, and there are no additional orders identified. ?Previous treatment date reviewed - Yes, and the appropriate amount of time has elapsed between treatments. ? ? ?Patient to proceed with treatment.   ? ?Patient reported no issues overnight regarding rituximab reaction.  Patient denied hives, chest tightness.  Patient stated he felt a decrease in energy level.  Patient informed the decreased in energy level is normal after infusion.  ?

## 2022-02-03 ENCOUNTER — Other Ambulatory Visit: Payer: Self-pay

## 2022-02-03 ENCOUNTER — Inpatient Hospital Stay: Payer: BC Managed Care – PPO

## 2022-02-03 ENCOUNTER — Inpatient Hospital Stay: Payer: BC Managed Care – PPO | Admitting: Oncology

## 2022-02-03 VITALS — BP 135/74 | HR 81 | Temp 97.8°F | Resp 18 | Ht 72.0 in | Wt 169.4 lb

## 2022-02-03 DIAGNOSIS — D539 Nutritional anemia, unspecified: Secondary | ICD-10-CM | POA: Diagnosis not present

## 2022-02-03 DIAGNOSIS — Z8042 Family history of malignant neoplasm of prostate: Secondary | ICD-10-CM | POA: Diagnosis not present

## 2022-02-03 DIAGNOSIS — C8580 Other specified types of non-Hodgkin lymphoma, unspecified site: Secondary | ICD-10-CM

## 2022-02-03 DIAGNOSIS — L298 Other pruritus: Secondary | ICD-10-CM | POA: Diagnosis not present

## 2022-02-03 DIAGNOSIS — Z5112 Encounter for antineoplastic immunotherapy: Secondary | ICD-10-CM | POA: Diagnosis not present

## 2022-02-03 DIAGNOSIS — R5381 Other malaise: Secondary | ICD-10-CM | POA: Diagnosis not present

## 2022-02-03 DIAGNOSIS — Z79899 Other long term (current) drug therapy: Secondary | ICD-10-CM | POA: Diagnosis not present

## 2022-02-03 DIAGNOSIS — C8330 Diffuse large B-cell lymphoma, unspecified site: Secondary | ICD-10-CM | POA: Diagnosis not present

## 2022-02-03 DIAGNOSIS — Z5111 Encounter for antineoplastic chemotherapy: Secondary | ICD-10-CM | POA: Diagnosis not present

## 2022-02-03 DIAGNOSIS — T451X5A Adverse effect of antineoplastic and immunosuppressive drugs, initial encounter: Secondary | ICD-10-CM | POA: Diagnosis not present

## 2022-02-03 DIAGNOSIS — D75839 Thrombocytosis, unspecified: Secondary | ICD-10-CM | POA: Diagnosis not present

## 2022-02-03 LAB — CBC WITH DIFFERENTIAL (CANCER CENTER ONLY)
Abs Immature Granulocytes: 0.01 10*3/uL (ref 0.00–0.07)
Basophils Absolute: 0 10*3/uL (ref 0.0–0.1)
Basophils Relative: 1 %
Eosinophils Absolute: 0.1 10*3/uL (ref 0.0–0.5)
Eosinophils Relative: 3 %
HCT: 33.8 % — ABNORMAL LOW (ref 39.0–52.0)
Hemoglobin: 11.2 g/dL — ABNORMAL LOW (ref 13.0–17.0)
Immature Granulocytes: 0 %
Lymphocytes Relative: 19 %
Lymphs Abs: 0.6 10*3/uL — ABNORMAL LOW (ref 0.7–4.0)
MCH: 31.3 pg (ref 26.0–34.0)
MCHC: 33.1 g/dL (ref 30.0–36.0)
MCV: 94.4 fL (ref 80.0–100.0)
Monocytes Absolute: 0.6 10*3/uL (ref 0.1–1.0)
Monocytes Relative: 20 %
Neutro Abs: 1.7 10*3/uL (ref 1.7–7.7)
Neutrophils Relative %: 57 %
Platelet Count: 284 10*3/uL (ref 150–400)
RBC: 3.58 MIL/uL — ABNORMAL LOW (ref 4.22–5.81)
RDW: 14 % (ref 11.5–15.5)
WBC Count: 2.9 10*3/uL — ABNORMAL LOW (ref 4.0–10.5)
nRBC: 0 % (ref 0.0–0.2)

## 2022-02-03 LAB — CMP (CANCER CENTER ONLY)
ALT: 15 U/L (ref 0–44)
AST: 21 U/L (ref 15–41)
Albumin: 4.2 g/dL (ref 3.5–5.0)
Alkaline Phosphatase: 54 U/L (ref 38–126)
Anion gap: 7 (ref 5–15)
BUN: 13 mg/dL (ref 8–23)
CO2: 28 mmol/L (ref 22–32)
Calcium: 9.2 mg/dL (ref 8.9–10.3)
Chloride: 102 mmol/L (ref 98–111)
Creatinine: 0.82 mg/dL (ref 0.61–1.24)
GFR, Estimated: >60 mL/min (ref >60)
Glucose, Bld: 98 mg/dL (ref 70–99)
Potassium: 4.2 mmol/L (ref 3.5–5.1)
Sodium: 137 mmol/L (ref 135–145)
Total Bilirubin: 0.9 mg/dL (ref 0.3–1.2)
Total Protein: 7.4 g/dL (ref 6.5–8.1)

## 2022-02-03 LAB — LACTATE DEHYDROGENASE: LDH: 181 U/L (ref 98–192)

## 2022-02-03 NOTE — Progress Notes (Signed)
?[facility] ?OFFICE PROGRESS NOTE ? ? ?Diagnosis: Non-Hodgkin's lymphoma ? ?INTERVAL HISTORY:  ? 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  He developed a rash during the rituximab infusion.  The rash resolved after he received IV Benadryl.  He developed firmness at the left arm IV site.  No erythema. ?He reports malaise for approximately 5 days following chemotherapy.  Mild nausea.  He did not take antiemetics.  No recurrence of the rash.  No mouth sores or diarrhea. ? ?He feels well at present.  He is exercising.  No night sweats.  He lost weight immediately following chemotherapy.  He has regained weight. ? ?Objective: ? ?Vital signs in last 24 hours: ? ?Blood pressure 135/74, pulse 81, temperature 97.8 ?F (36.6 ?C), temperature source Oral, resp. rate 18, height 6' (1.829 m), weight 169 lb 6.4 oz (76.8 kg), SpO2 98 %. ?  ? ?HEENT: No thrush or ulcers ?Lymphatics: No cervical or supraclavicular nodes.  1/2-1 cm mobile bilateral axillary nodes.  The dominant 2 cm node in the right mid inguinal region appears smaller.  Mobile nodularity at the lateral inguinal region bilaterally-tiny lymph nodes?Marland Kitchen  No femoral nodes. ?Resp: Lungs clear bilaterally ?Cardio: Regular rate and rhythm ?GI: No hepatosplenomegaly, nontender, no mass ?Vascular: No leg edema, palpable cord at the left forearm IV site-no erythema or tenderness ? ?Portacath/PICC-without erythema ? ?Lab Results: ? ?Lab Results  ?Component Value Date  ? WBC 2.9 (L) 02/03/2022  ? HGB 11.2 (L) 02/03/2022  ? HCT 33.8 (L) 02/03/2022  ? MCV 94.4 02/03/2022  ? PLT 284 02/03/2022  ? NEUTROABS 1.7 02/03/2022  ? ? ?CMP  ?Lab Results  ?Component Value Date  ? NA 137 02/03/2022  ? K 4.2 02/03/2022  ? CL 102 02/03/2022  ? CO2 28 02/03/2022  ? GLUCOSE 98 02/03/2022  ? BUN 13 02/03/2022  ? CREATININE 0.82 02/03/2022  ? CALCIUM 9.2 02/03/2022  ? PROT 7.4 02/03/2022  ? ALBUMIN 4.2 02/03/2022  ? AST 21 02/03/2022  ? ALT 15  02/03/2022  ? ALKPHOS 54 02/03/2022  ? BILITOT 0.9 02/03/2022  ? GFRNONAA >60 02/03/2022  ? GFRAA 101 09/25/2019  ? ? ? ?Medications: I have reviewed the patient's current medications. ? ? ?Assessment/Plan: ?Low-grade B-cell non-Hodgkin's lymphoma ?Palpable lymphadenopathy ?IgG kappa serum M spike with elevated free kappa light chain ?Bone marrow biopsy 12/05/2021-hypercellular marrow, 95%, diffusely infiltrated by B-cell lymphoma, no expression of CD5 or CD10, CD20 positive.  Differential diagnosis includes marginal zone lymphoma and lymphoplasmacytic lymphoma, flow cytometry identified a kappa restricted B-cell population compromising 80% of lymphocytes, 54 XY karyotype ?CTs 12/08/2021-lipoma to left lower neck/upper chest, hepatomegaly with numerous liver cysts, prominent right inguinal node, no splenomegaly, no suspicious bone lesions, 9 mm focus of hyperenhancement in the left hepatic lobe, small right adrenal nodule ?Cycle 1 Bendamustine/rituximab 12/18/2021 ?Cycle 2 Bendamustine/rituximab 01/15/2022 ? ?2.   Macrocytic anemia secondary to #1 ?3.   Mild thrombocytosis ?4.   History of prostate cancer ?5.   Malaise secondary to #1 ?6.   Pruritus/rash during rituximab infusion 12/18/2021-improved with additional Benadryl, Pepcid, completed rituximab infusion ? 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  He has tolerated the chemotherapy well aside from a rash with rituximab and posttreatment malaise.  The palpable peripheral lymph nodes are slightly smaller.  There is no evidence for disease progression.  It is difficult to judge whether he is having aN adequate response to the Bendamustine/rituximab.  We will follow-up on the serum  M protein, IgG, and beta-2 microglobulin levels from today.  We will be in contact with him regarding these results. ? ?We will decide on proceeding with cycle 3 Bendamustine/rituximab based on the above results.  We will consider referring him for a restaging  bone marrow biopsy after cycle 3. ? ?He is scheduled to begin cycle 3 Bendamustine/Rituxan on 02/12/2022.  We will give additional Benadryl during the rituximab infusion. ? ? ? ?[name], MD ? ?02/03/2022  ?4:36 PM ? ? ?

## 2022-02-04 LAB — PROTEIN ELECTROPHORESIS, SERUM
A/G Ratio: 1.3 (ref 0.7–1.7)
Albumin ELP: 4 g/dL (ref 2.9–4.4)
Alpha-1-Globulin: 0.3 g/dL (ref 0.0–0.4)
Alpha-2-Globulin: 0.5 g/dL (ref 0.4–1.0)
Beta Globulin: 0.7 g/dL (ref 0.7–1.3)
Gamma Globulin: 1.6 g/dL (ref 0.4–1.8)
Globulin, Total: 3.1 g/dL (ref 2.2–3.9)
M-Spike, %: 1.3 g/dL — ABNORMAL HIGH
Total Protein ELP: 7.1 g/dL (ref 6.0–8.5)

## 2022-02-04 LAB — BETA 2 MICROGLOBULIN, SERUM: Beta-2 Microglobulin: 2.4 mg/L (ref 0.6–2.4)

## 2022-02-04 LAB — IGG: IgG (Immunoglobin G), Serum: 1742 mg/dL — ABNORMAL HIGH (ref 603–1613)

## 2022-02-05 ENCOUNTER — Other Ambulatory Visit: Payer: Self-pay | Admitting: *Deleted

## 2022-02-05 DIAGNOSIS — C8580 Other specified types of non-Hodgkin lymphoma, unspecified site: Secondary | ICD-10-CM

## 2022-02-05 NOTE — Progress Notes (Signed)
Added CMP to 4/6 ?

## 2022-02-07 ENCOUNTER — Other Ambulatory Visit: Payer: Self-pay | Admitting: Oncology

## 2022-02-11 ENCOUNTER — Other Ambulatory Visit: Payer: Self-pay | Admitting: Oncology

## 2022-02-12 ENCOUNTER — Inpatient Hospital Stay: Payer: BC Managed Care – PPO | Attending: Oncology

## 2022-02-12 ENCOUNTER — Inpatient Hospital Stay: Payer: BC Managed Care – PPO

## 2022-02-12 VITALS — BP 133/83 | HR 66 | Temp 97.8°F | Resp 18 | Ht 72.0 in | Wt 167.2 lb

## 2022-02-12 DIAGNOSIS — R5381 Other malaise: Secondary | ICD-10-CM | POA: Insufficient documentation

## 2022-02-12 DIAGNOSIS — Z5112 Encounter for antineoplastic immunotherapy: Secondary | ICD-10-CM | POA: Diagnosis not present

## 2022-02-12 DIAGNOSIS — D75839 Thrombocytosis, unspecified: Secondary | ICD-10-CM | POA: Insufficient documentation

## 2022-02-12 DIAGNOSIS — Z8042 Family history of malignant neoplasm of prostate: Secondary | ICD-10-CM | POA: Insufficient documentation

## 2022-02-12 DIAGNOSIS — C8580 Other specified types of non-Hodgkin lymphoma, unspecified site: Secondary | ICD-10-CM

## 2022-02-12 DIAGNOSIS — C8330 Diffuse large B-cell lymphoma, unspecified site: Secondary | ICD-10-CM | POA: Diagnosis not present

## 2022-02-12 DIAGNOSIS — Z5111 Encounter for antineoplastic chemotherapy: Secondary | ICD-10-CM | POA: Diagnosis not present

## 2022-02-12 DIAGNOSIS — L299 Pruritus, unspecified: Secondary | ICD-10-CM | POA: Insufficient documentation

## 2022-02-12 DIAGNOSIS — D539 Nutritional anemia, unspecified: Secondary | ICD-10-CM | POA: Insufficient documentation

## 2022-02-12 DIAGNOSIS — Z79899 Other long term (current) drug therapy: Secondary | ICD-10-CM | POA: Insufficient documentation

## 2022-02-12 DIAGNOSIS — T451X5A Adverse effect of antineoplastic and immunosuppressive drugs, initial encounter: Secondary | ICD-10-CM | POA: Insufficient documentation

## 2022-02-12 DIAGNOSIS — R21 Rash and other nonspecific skin eruption: Secondary | ICD-10-CM | POA: Diagnosis not present

## 2022-02-12 LAB — CBC WITH DIFFERENTIAL (CANCER CENTER ONLY)
Abs Immature Granulocytes: 0 10*3/uL (ref 0.00–0.07)
Basophils Absolute: 0 10*3/uL (ref 0.0–0.1)
Basophils Relative: 1 %
Eosinophils Absolute: 0.1 10*3/uL (ref 0.0–0.5)
Eosinophils Relative: 4 %
HCT: 38 % — ABNORMAL LOW (ref 39.0–52.0)
Hemoglobin: 12.7 g/dL — ABNORMAL LOW (ref 13.0–17.0)
Immature Granulocytes: 0 %
Lymphocytes Relative: 13 %
Lymphs Abs: 0.4 10*3/uL — ABNORMAL LOW (ref 0.7–4.0)
MCH: 31.9 pg (ref 26.0–34.0)
MCHC: 33.4 g/dL (ref 30.0–36.0)
MCV: 95.5 fL (ref 80.0–100.0)
Monocytes Absolute: 0.6 10*3/uL (ref 0.1–1.0)
Monocytes Relative: 21 %
Neutro Abs: 1.6 10*3/uL — ABNORMAL LOW (ref 1.7–7.7)
Neutrophils Relative %: 61 %
Platelet Count: 262 10*3/uL (ref 150–400)
RBC: 3.98 MIL/uL — ABNORMAL LOW (ref 4.22–5.81)
RDW: 14.6 % (ref 11.5–15.5)
WBC Count: 2.7 10*3/uL — ABNORMAL LOW (ref 4.0–10.5)
nRBC: 0 % (ref 0.0–0.2)

## 2022-02-12 LAB — CMP (CANCER CENTER ONLY)
ALT: 25 U/L (ref 0–44)
AST: 22 U/L (ref 15–41)
Albumin: 4.3 g/dL (ref 3.5–5.0)
Alkaline Phosphatase: 63 U/L (ref 38–126)
Anion gap: 8 (ref 5–15)
BUN: 18 mg/dL (ref 8–23)
CO2: 27 mmol/L (ref 22–32)
Calcium: 9.2 mg/dL (ref 8.9–10.3)
Chloride: 104 mmol/L (ref 98–111)
Creatinine: 0.94 mg/dL (ref 0.61–1.24)
GFR, Estimated: >60 mL/min (ref >60)
Glucose, Bld: 117 mg/dL — ABNORMAL HIGH (ref 70–99)
Potassium: 4.1 mmol/L (ref 3.5–5.1)
Sodium: 139 mmol/L (ref 135–145)
Total Bilirubin: 1 mg/dL (ref 0.3–1.2)
Total Protein: 7.5 g/dL (ref 6.5–8.1)

## 2022-02-12 MED ORDER — DIPHENHYDRAMINE HCL 50 MG/ML IJ SOLN
25.0000 mg | Freq: Once | INTRAMUSCULAR | Status: AC
  Administered 2022-02-12: 25 mg via INTRAVENOUS
  Filled 2022-02-12: qty 1

## 2022-02-12 MED ORDER — SODIUM CHLORIDE 0.9 % IV SOLN: Freq: Once | INTRAVENOUS | Status: AC

## 2022-02-12 MED ORDER — SODIUM CHLORIDE 0.9 % IV SOLN
10.0000 mg | Freq: Once | INTRAVENOUS | Status: AC
  Administered 2022-02-12: 10 mg via INTRAVENOUS
  Filled 2022-02-12: qty 10

## 2022-02-12 MED ORDER — FAMOTIDINE IN NACL 20-0.9 MG/50ML-% IV SOLN
20.0000 mg | Freq: Once | INTRAVENOUS | Status: AC
  Administered 2022-02-12: 20 mg via INTRAVENOUS
  Filled 2022-02-12: qty 50

## 2022-02-12 MED ORDER — SODIUM CHLORIDE 0.9 % IV SOLN
375.0000 mg/m2 | Freq: Once | INTRAVENOUS | Status: AC
  Administered 2022-02-12: 800 mg via INTRAVENOUS
  Filled 2022-02-12: qty 30

## 2022-02-12 MED ORDER — SODIUM CHLORIDE 0.9 % IV SOLN
90.0000 mg/m2 | Freq: Once | INTRAVENOUS | Status: AC
  Administered 2022-02-12: 175 mg via INTRAVENOUS
  Filled 2022-02-12: qty 7

## 2022-02-12 MED ORDER — METHYLPREDNISOLONE SODIUM SUCC 125 MG IJ SOLR
125.0000 mg | Freq: Once | INTRAMUSCULAR | Status: AC | PRN
  Administered 2022-02-12: 125 mg via INTRAVENOUS

## 2022-02-12 MED ORDER — ACETAMINOPHEN 325 MG PO TABS
650.0000 mg | ORAL_TABLET | Freq: Once | ORAL | Status: AC
  Administered 2022-02-12: 650 mg via ORAL
  Filled 2022-02-12: qty 2

## 2022-02-12 MED ORDER — PALONOSETRON HCL INJECTION 0.25 MG/5ML
0.2500 mg | Freq: Once | INTRAVENOUS | Status: AC
  Administered 2022-02-12: 0.25 mg via INTRAVENOUS
  Filled 2022-02-12: qty 5

## 2022-02-12 NOTE — Progress Notes (Signed)
Hypersensitivity Reaction note ? ?Date of event: 02/12/22 ?Time of event: 1435 ?Generic name of drug involved: Rituximab (RUXIENCE) ?Name of provider notified of the hypersensitivity reaction: Dr. [name] ?Was agent that likely caused hypersensitivity reaction added to Allergies List within EMR? Yes from previous reactions and also updated today. ?Chain of events including reaction signs/symptoms, treatment administered, and outcome (e.g., drug resumed; drug discontinued; sent to Emergency Department; etc.) Patient presents today for D 1 C 3 Rituximab/Bendeka. Patient infusion rate was re-challenged like a first time treatment. 90 minutes into Rituxan infusion (that is at the end of his 150 mg/hr rate), he started complaining of slight itching in his ear and head, denied rash/hives, redness, sweating, SOB, pain. A second dose of Benadryl 25 mg injection had been added to his treatment today to be administered 2 hours into infusion. MD gave an okay to administer this now. Benadryl 25 mg injection administered at 1326 (90 minutes into Rituxan infusion). Patient continued with infusion, by the 2.5 hours into infusion (250 mg/hr rate), he started complaining of sinus drainage, watery eyes and itching in his ears and head. Infusion stopped and MD notified. Vitals continue to remain stable with treatment. No rash/hives, redness, sweating, pain or SOB noted or verbalized. MD ordered patient be observed for 15 minutes and resume treatment at previous rate if symptoms resolved, if symptoms do  not resolve after 15 minutes, administer Solu-medrol 125 mg injection and restart infusion at previous rate of 200 mg/hr. Order followed and patient tolerated the rest of the infusion at 200 mg/hr.   ? ?Georgianne Fick, RN ?02/12/2022 4:18 PM ? ?

## 2022-02-12 NOTE — Progress Notes (Signed)
Patient presents for treatment. RN assessment completed along with the following: ? ?Labs/vitals reviewed - Yes, and within treatment parameters.   ?Weight within 10% of previous measurement - Yes ?Informed consent completed and reflects current therapy/intent - Yes, on date 12/18/2021             ?Provider progress note reviewed - Patient not seen by provider today. Most recent note dated 02/03/2022 reviewed. ?Treatment/Antibody/Supportive plan reviewed - Yes, and there are no adjustments needed for today's treatment. ?S&H and other orders reviewed - Yes, and there are no additional orders identified. ?Previous treatment date reviewed - Yes, and the appropriate amount of time has elapsed between treatments. ?Clinic Hand Off Received from - No ? ?Patient to proceed with treatment.  ? ?

## 2022-02-12 NOTE — Patient Instructions (Signed)
[facility]   ?Discharge Instructions: ?Thank you for choosing [facility] to provide your oncology and hematology care.  ? ?If you have a lab appointment with the [facility], please go directly to the [facility] and check in at the registration area. ?  ?Wear comfortable clothing and clothing appropriate for easy access to any Portacath or PICC line.  ? ?We strive to give you quality time with your provider. You may need to reschedule your appointment if you arrive late (15 or more minutes).  Arriving late affects you and other patients whose appointments are after yours.  Also, if you miss three or more appointments without notifying the office, you may be dismissed from the clinic at the provider?s discretion.    ?  ?For prescription refill requests, have your pharmacy contact our office and allow 72 hours for refills to be completed.   ? ?Today you received the following chemotherapy and/or immunotherapy agents Rituximab-pvvr (RUXIENCE) & Bendamustine (BENDEKA).    ?  ?To help prevent nausea and vomiting after your treatment, we encourage you to take your nausea medication as directed. ? ?BELOW ARE SYMPTOMS THAT SHOULD BE REPORTED IMMEDIATELY: ?*FEVER GREATER THAN 100.4 F (38 ?C) OR HIGHER ?*CHILLS OR SWEATING ?*NAUSEA AND VOMITING THAT IS NOT CONTROLLED WITH YOUR NAUSEA MEDICATION ?*UNUSUAL SHORTNESS OF BREATH ?*UNUSUAL BRUISING OR BLEEDING ?*URINARY PROBLEMS (pain or burning when urinating, or frequent urination) ?*BOWEL PROBLEMS (unusual diarrhea, constipation, pain near the anus) ?TENDERNESS IN MOUTH AND THROAT WITH OR WITHOUT PRESENCE OF ULCERS (sore throat, sores in mouth, or a toothache) ?UNUSUAL RASH, SWELLING OR PAIN  ?UNUSUAL VAGINAL DISCHARGE OR ITCHING  ? ?Items with * indicate a potential emergency and should be followed up as soon as possible or go to the Emergency Department if any problems should occur. ? ?Please show the CHEMOTHERAPY ALERT CARD or  IMMUNOTHERAPY ALERT CARD at check-in to the Emergency Department and triage nurse. ? ?Should you have questions after your visit or need to cancel or reschedule your appointment, please contact [facility]  Dept: [phone]  and follow the prompts.  Office hours are 8:00 a.m. to 4:30 p.m. Monday - Friday. Please note that voicemails left after 4:00 p.m. may not be returned until the following business day.  We are closed weekends and major holidays. You have access to a nurse at all times for urgent questions. Please call the main number to the clinic Dept: [phone] and follow the prompts. ? ? ?For any non-urgent questions, you may also contact your provider using MyChart. We now offer e-Visits for anyone 40 and older to request care online for non-urgent symptoms. For details visit [email or web address]. ?  ?Also download the MyChart app! Go to the app store, search "MyChart", open the app, select [facility], and log in with your MyChart username and password. ? ?Due to Covid, a mask is required upon entering the hospital/clinic. If you do not have a mask, one will be given to you upon arrival. For doctor visits, patients may have 1 support person aged 23 or older with them. For treatment visits, patients cannot have anyone with them due to current Covid guidelines and our immunocompromised population.  ? ?Rituximab Injection ?What is this medication? ?RITUXIMAB (ri TUX i mab) is a monoclonal antibody. It is used to treat certain types of cancer like non-Hodgkin lymphoma and chronic lymphocytic leukemia. It is also used to treat rheumatoid arthritis, granulomatosis with polyangiitis, microscopic polyangiitis,  and pemphigus vulgaris. ?This medicine may be used for other purposes; ask your health care provider or pharmacist if you have questions. ?COMMON BRAND NAME(S): RIABNI, Rituxan, RUXIENCE ?What should I tell my care team before I take this medication? ?They need to know if  you have any of these conditions: ?chest pain ?heart disease ?infection especially a viral infection such as chickenpox, cold sores, hepatitis B, or herpes ?immune system problems ?irregular heartbeat or rhythm ?kidney disease ?low blood counts (white cells, platelets, or red cells) ?lung disease ?recent or upcoming vaccine ?an unusual or allergic reaction to rituximab, other medicines, foods, dyes, or preservatives ?pregnant or trying to get pregnant ?breast-feeding ?How should I use this medication? ?This medicine is injected into a vein. It is given by a health care provider in a hospital or clinic setting. ?A special MedGuide will be given to you before each treatment. Be sure to read this information carefully each time. ?Talk to your health care provider about the use of this medicine in children. While this drug may be prescribed for children as young as 6 months for selected conditions, precautions do apply. ?Overdosage: If you think you have taken too much of this medicine contact a poison control center or emergency room at once. ?NOTE: This medicine is only for you. Do not share this medicine with others. ?What if I miss a dose? ?Keep appointments for follow-up doses. It is important not to miss your dose. Call your health care provider if you are unable to keep an appointment. ?What may interact with this medication? ?Do not take this medicine with any of the following medicines: ?live vaccines ?This medicine may also interact with the following medicines: ?cisplatin ?This list may not describe all possible interactions. Give your health care provider a list of all the medicines, herbs, non-prescription drugs, or dietary supplements you use. Also tell them if you smoke, drink alcohol, or use illegal drugs. Some items may interact with your medicine. ?What should I watch for while using this medication? ?Your condition will be monitored carefully while you are receiving this medicine. You may need blood  work done while you are taking this medicine. ?This medicine can cause serious infusion reactions. To reduce the risk your health care provider may give you other medicines to take before receiving this one. Be sure to follow the directions from your health care provider. ?This medicine may increase your risk of getting an infection. Call your health care provider for advice if you get a fever, chills, sore throat, or other symptoms of a cold or flu. Do not treat yourself. Try to avoid being around people who are sick. ?Call your health care provider if you are around anyone with measles, chickenpox, or if you develop sores or blisters that do not heal properly. ?Avoid taking medicines that contain aspirin, acetaminophen, ibuprofen, naproxen, or ketoprofen unless instructed by your health care provider. These medicines may hide a fever. ?This medicine may cause serious skin reactions. They can happen weeks to months after starting the medicine. Contact your health care provider right away if you notice fevers or flu-like symptoms with a rash. The rash may be red or purple and then turn into blisters or peeling of the skin. Or, you might notice a red rash with swelling of the face, lips or lymph nodes in your neck or under your arms. ?In some patients, this medicine may cause a serious brain infection that may cause death. If you have any problems seeing, thinking, speaking,  walking, or standing, tell your healthcare professional right away. If you cannot reach your healthcare professional, urgently seek other source of medical care. ?Do not become pregnant while taking this medicine or for at least 12 months after stopping it. Women should inform their health care provider if they wish to become pregnant or think they might be pregnant. There is potential for serious harm to an unborn child. Talk to your health care provider for more information. Women should use a reliable form of birth control while taking this  medicine and for 12 months after stopping it. Do not breast-feed while taking this medicine or for at least 6 months after stopping it. ?What side effects may I notice from receiving this medication? ?Side

## 2022-02-13 ENCOUNTER — Inpatient Hospital Stay: Payer: BC Managed Care – PPO

## 2022-02-13 VITALS — BP 144/78 | HR 78 | Temp 97.7°F | Resp 18

## 2022-02-13 DIAGNOSIS — C8330 Diffuse large B-cell lymphoma, unspecified site: Secondary | ICD-10-CM | POA: Diagnosis not present

## 2022-02-13 DIAGNOSIS — D539 Nutritional anemia, unspecified: Secondary | ICD-10-CM | POA: Diagnosis not present

## 2022-02-13 DIAGNOSIS — T451X5A Adverse effect of antineoplastic and immunosuppressive drugs, initial encounter: Secondary | ICD-10-CM | POA: Diagnosis not present

## 2022-02-13 DIAGNOSIS — R21 Rash and other nonspecific skin eruption: Secondary | ICD-10-CM | POA: Diagnosis not present

## 2022-02-13 DIAGNOSIS — Z5112 Encounter for antineoplastic immunotherapy: Secondary | ICD-10-CM | POA: Diagnosis not present

## 2022-02-13 DIAGNOSIS — R5381 Other malaise: Secondary | ICD-10-CM | POA: Diagnosis not present

## 2022-02-13 DIAGNOSIS — D75839 Thrombocytosis, unspecified: Secondary | ICD-10-CM | POA: Diagnosis not present

## 2022-02-13 DIAGNOSIS — L299 Pruritus, unspecified: Secondary | ICD-10-CM | POA: Diagnosis not present

## 2022-02-13 DIAGNOSIS — Z8042 Family history of malignant neoplasm of prostate: Secondary | ICD-10-CM | POA: Diagnosis not present

## 2022-02-13 DIAGNOSIS — Z5111 Encounter for antineoplastic chemotherapy: Secondary | ICD-10-CM | POA: Diagnosis not present

## 2022-02-13 DIAGNOSIS — Z79899 Other long term (current) drug therapy: Secondary | ICD-10-CM | POA: Diagnosis not present

## 2022-02-13 DIAGNOSIS — C8580 Other specified types of non-Hodgkin lymphoma, unspecified site: Secondary | ICD-10-CM

## 2022-02-13 MED ORDER — SODIUM CHLORIDE 0.9 % IV SOLN
90.0000 mg/m2 | Freq: Once | INTRAVENOUS | Status: AC
  Administered 2022-02-13: 175 mg via INTRAVENOUS
  Filled 2022-02-13: qty 7

## 2022-02-13 MED ORDER — SODIUM CHLORIDE 0.9 % IV SOLN: Freq: Once | INTRAVENOUS | Status: AC

## 2022-02-13 MED ORDER — SODIUM CHLORIDE 0.9 % IV SOLN
10.0000 mg | Freq: Once | INTRAVENOUS | Status: AC
  Administered 2022-02-13: 10 mg via INTRAVENOUS
  Filled 2022-02-13: qty 1

## 2022-02-13 NOTE — Patient Instructions (Signed)
[facility]  Discharge Instructions: ?Thank you for choosing [facility] to provide your oncology and hematology care.  ? ?If you have a lab appointment with the [facility], please go directly to the [facility] and check in at the registration area. ?  ?Wear comfortable clothing and clothing appropriate for easy access to any Portacath or PICC line.  ? ?We strive to give you quality time with your provider. You may need to reschedule your appointment if you arrive late (15 or more minutes).  Arriving late affects you and other patients whose appointments are after yours.  Also, if you miss three or more appointments without notifying the office, you may be dismissed from the clinic at the provider?s discretion.    ?  ?For prescription refill requests, have your pharmacy contact our office and allow 72 hours for refills to be completed.   ? ?Today you received the following chemotherapy and/or immunotherapy agents: Bendamustine ? ?Bendamustine Injection ?What is this medication? ?BENDAMUSTINE (BEN da MUS teen) is a chemotherapy drug. It is used to treat chronic lymphocytic leukemia and non-Hodgkin lymphoma. ?This medicine may be used for other purposes; ask your health care provider or pharmacist if you have questions. ?COMMON BRAND NAME(S): BELRAPZO, BENDEKA, Treanda ?What should I tell my care team before I take this medication? ?They need to know if you have any of these conditions: ?infection (especially a virus infection such as chickenpox, cold sores, or herpes) ?kidney disease ?liver disease ?an unusual or allergic reaction to bendamustine, mannitol, other medicines, foods, dyes, or preservatives ?pregnant or trying to get pregnant ?breast-feeding ?How should I use this medication? ?This medicine is for infusion into a vein. It is given by a health care professional in a hospital or clinic setting. ?Talk to your pediatrician regarding the use of this medicine in  children. Special care may be needed. ?Overdosage: If you think you have taken too much of this medicine contact a poison control center or emergency room at once. ?NOTE: This medicine is only for you. Do not share this medicine with others. ?What if I miss a dose? ?It is important not to miss your dose. Call your doctor or health care professional if you are unable to keep an appointment. ?What may interact with this medication? ?Do not take this medicine with any of the following medications: ?clozapine ?This medicine may also interact with the following medications: ?atazanavir ?cimetidine ?ciprofloxacin ?enoxacin ?fluvoxamine ?medicines for seizures like carbamazepine and phenobarbital ?mexiletine ?rifampin ?tacrine ?thiabendazole ?zileuton ?This list may not describe all possible interactions. Give your health care provider a list of all the medicines, herbs, non-prescription drugs, or dietary supplements you use. Also tell them if you smoke, drink alcohol, or use illegal drugs. Some items may interact with your medicine. ?What should I watch for while using this medication? ?This drug may make you feel generally unwell. This is not uncommon, as chemotherapy can affect healthy cells as well as cancer cells. Report any side effects. Continue your course of treatment even though you feel ill unless your doctor tells you to stop. ?You may need blood work done while you are taking this medicine. ?Call your doctor or healthcare provider for advice if you get a fever, chills or sore throat, or other symptoms of a cold or flu. Do not treat yourself. This drug decreases your body's ability to fight infections. Try to avoid being around people who are sick. ?This medicine may cause serious skin reactions.  They can happen weeks to months after starting the medicine. Contact your healthcare provider right away if you notice fevers or flu-like symptoms with a rash. The rash may be red or purple and then turn into blisters  or peeling of the skin. Or, you might notice a red rash with swelling of the face, lips or lymph nodes in your neck or under your arms. ?In some patients, this medicine may cause a serious brain infection that may cause death. If you have any problems seeing, thinking, speaking, walking, or standing, tell your health care provider right away. If you cannot reach your health care provider, urgently seek other source of medical care. ?This medicine may increase your risk to bruise or bleed. Call your doctor or healthcare provider if you notice any unusual bleeding. ?Talk to your doctor about your risk of cancer. You may be more at risk for certain types of cancers if you take this medicine. ?This medicine may increase your risk of skin cancer. Check your skin for changes to moles or for new growths while taking this medicine. Call your health care provider if you notice any of these skin changes. ?Do not become pregnant while taking this medicine or for at least 6 months after stopping it. Women should inform their doctor if they wish to become pregnant or think they might be pregnant. Men should not father a child while taking this medicine and for at least 3 months after stopping it. There is a potential for serious side effects to an unborn child. Talk to your healthcare provider or pharmacist for more information. Do not breast-feed an infant while taking this medicine or for at least 1 week after stopping it. ?This medicine may make it more difficult to father a child. You should talk with your doctor or healthcare provider if you are concerned about your fertility. ?What side effects may I notice from receiving this medication? ?Side effects that you should report to your doctor or health care professional as soon as possible: ?allergic reactions like skin rash, itching or hives, swelling of the face, lips, or tongue ?low blood counts - this medicine may decrease the number of white blood cells, red blood cells  and platelets. You may be at increased risk for infections and bleeding. ?rash, fever, and swollen lymph nodes ?redness, blistering, peeling, or loosening of the skin, including inside the mouth ?signs of infection like fever or chills, cough, sore throat, pain or difficulty passing urine ?signs of decreased platelets or bleeding like bruising, pinpoint red spots on the skin, black, tarry stools, blood in the urine ?signs of decreased red blood cells like being unusually weak or tired, fainting spells, lightheadedness ?signs and symptoms of kidney injury like trouble passing urine or change in the amount of urine ?signs and symptoms of liver injury like dark yellow or brown urine; general ill feeling or flu-like symptoms; light-colored stools; loss of appetite; nausea; right upper belly pain; unusually weak or tired; yellowing of the eyes or skin ?Side effects that usually do not require medical attention (report to your doctor or health care professional if they continue or are bothersome): ?constipation ?decreased appetite ?diarrhea ?headache ?mouth sores ?nausea, vomiting ?tiredness ?This list may not describe all possible side effects. Call your doctor for medical advice about side effects. You may report side effects to FDA at 1-800-FDA-1088. ?Where should I keep my medication? ?This drug is given in a hospital or clinic and will not be stored at home. ?NOTE: This sheet  is a summary. It may not cover all possible information. If you have questions about this medicine, talk to your doctor, pharmacist, or health care provider. ?? 2022 Elsevier/Gold Standard (2020-04-23 00:00:00) ?    ?  ?To help prevent nausea and vomiting after your treatment, we encourage you to take your nausea medication as directed. ? ?BELOW ARE SYMPTOMS THAT SHOULD BE REPORTED IMMEDIATELY: ?*FEVER GREATER THAN 100.4 F (38 ?C) OR HIGHER ?*CHILLS OR SWEATING ?*NAUSEA AND VOMITING THAT IS NOT CONTROLLED WITH YOUR NAUSEA MEDICATION ?*UNUSUAL  SHORTNESS OF BREATH ?*UNUSUAL BRUISING OR BLEEDING ?*URINARY PROBLEMS (pain or burning when urinating, or frequent urination) ?*BOWEL PROBLEMS (unusual diarrhea, constipation, pain near the anus) ?T[facility]

## 2022-02-13 NOTE — Progress Notes (Signed)
Patient presents for treatment. RN assessment completed along with the following: ? ?Labs/vitals reviewed - Yes, and within treatment parameters.   ?Weight within 10% of previous measurement - Yes ?Informed consent completed and reflects current therapy/intent - Yes, on date 12/18/21             ?Provider progress note reviewed - Patient not seen by provider today. Most recent note dated 02/03/22 reviewed. ?Treatment/Antibody/Supportive plan reviewed - Yes, and there are no adjustments needed for today's treatment. ?S&H and other orders reviewed - Yes, and there are no additional orders identified. ?Previous treatment date reviewed - Yes, and the appropriate amount of time has elapsed between treatments. ? ? ?Patient to proceed with treatment.   ? ?Patient stated he had itchiness in his toes and fingers last night after getting home.  He stated he took a dose of PO benadryl and the symptoms subsided.  Patient states he had no other signs or symptoms after yesterday's (02/12/22) treatment.  ?

## 2022-03-02 NOTE — Progress Notes (Signed)
?[facility] ?OFFICE PROGRESS NOTE ? ? ?Diagnosis: Non-Hodgkin's lymphoma ? ?INTERVAL HISTORY:  ? 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  He reports no rash following the cycle chemotherapy.  He had pruritus during the rituximab infusion.  He was able to complete the rituximab infusion. ? ?He reports malaise for 3 to 4 days following chemotherapy.  The palpable lymph nodes are partially improved.  No new complaint.  He is exercising.  He believes his energy level is slightly improved compared to prechemotherapy. ? ?Objective: ? ?Vital signs in last 24 hours: ? ?Blood pressure (!) 142/77, pulse 72, temperature 98.1 ?F (36.7 ?C), temperature source Oral, resp. rate 18, height 6' (1.829 m), weight 170 lb (77.1 kg), SpO2 100 %. ?  ? ?HEENT: No thrush, ulcer at the lower left anterior buccal mucosa (reports biting himself) ?Lymphatics: No cervical or supraclavicular nodes.  "Shotty "bilateral axillary nodes.  Shotty inguinal nodes versus prominent musculoskeletal tissue, no dominant right inguinal node ?Resp: Lungs clear bilaterally ?Cardio: Regular rate and rhythm ?GI: No hepatosplenomegaly ?Vascular: No leg edema ? ? ?Lab Results: ? ?Lab Results  ?Component Value Date  ? WBC 2.2 (L) 03/03/2022  ? HGB 12.3 (L) 03/03/2022  ? HCT 37.0 (L) 03/03/2022  ? MCV 97.1 03/03/2022  ? PLT 280 03/03/2022  ? NEUTROABS 1.1 (L) 03/03/2022  ? ? ?CMP  ?Lab Results  ?Component Value Date  ? NA 140 03/03/2022  ? K 4.9 03/03/2022  ? CL 104 03/03/2022  ? CO2 31 03/03/2022  ? GLUCOSE 104 (H) 03/03/2022  ? BUN 17 03/03/2022  ? CREATININE 0.85 03/03/2022  ? CALCIUM 9.8 03/03/2022  ? PROT 7.2 03/03/2022  ? ALBUMIN 4.2 03/03/2022  ? AST 19 03/03/2022  ? ALT 18 03/03/2022  ? ALKPHOS 52 03/03/2022  ? BILITOT 1.0 03/03/2022  ? GFRNONAA >60 03/03/2022  ? GFRAA 101 09/25/2019  ? ? ? ?Medications: I have reviewed the patient's current medications. ? ? ?Assessment/Plan: ?Low-grade B-cell non-Hodgkin's  lymphoma ?Palpable lymphadenopathy ?IgG kappa serum M spike with elevated free kappa light chain ?Bone marrow biopsy 12/05/2021-hypercellular marrow, 95%, diffusely infiltrated by B-cell lymphoma, no expression of CD5 or CD10, CD20 positive.  Differential diagnosis includes marginal zone lymphoma and lymphoplasmacytic lymphoma, flow cytometry identified a kappa restricted B-cell population compromising 80% of lymphocytes, 46 XY karyotype ?CTs 12/08/2021-lipoma to left lower neck/upper chest, hepatomegaly with numerous liver cysts, prominent right inguinal node, no splenomegaly, no suspicious bone lesions, 9 mm focus of hyperenhancement in the left hepatic lobe, small right adrenal nodule ?Cycle 1 Bendamustine/rituximab 12/18/2021 ?Cycle 2 Bendamustine/rituximab 01/15/2022 ?Cycle 3 Bendamustine/rituximab 02/12/2022, Bendamustine dose escalated to 90 mg per metered squared ? ?2.   Macrocytic anemia secondary to #1 ?3.   Mild thrombocytosis ?4.   History of prostate cancer ?5.   Malaise secondary to #1 ?6.   Pruritus/rash during rituximab infusion 12/18/2021-improved with additional Benadryl, Pepcid, completed rituximab infusion ? ? ? 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  Has completed 3 cycles of Bendamustine/rituximab.  He has tolerated the treatment well aside from pruritus and a rash during rituximab infusions.  He did not develop a rash with cycle 3.  He has mild neutropenia today. ? ?The palpable lymphadenopathy is partially improved.  We will follow-up on the IgG, serum M spike, and beta-2 microglobulin levels from today.  We discussed proceeding with cycle 4 versus restaging now.  He will complete cycle 4 beginning 03/11/2021 if the neutrophil count has recovered.  He will be scheduled for   restaging bone marrow biopsy 03/31/2021 and return for an office visit on 04/02/2021. ? ? ? ?Gary [name], MD ? ?03/03/2022  ?8:57 AM ? ? ?

## 2022-03-03 ENCOUNTER — Encounter: Payer: Self-pay | Admitting: Oncology

## 2022-03-03 ENCOUNTER — Inpatient Hospital Stay: Payer: BC Managed Care – PPO

## 2022-03-03 ENCOUNTER — Inpatient Hospital Stay: Payer: BC Managed Care – PPO | Admitting: Oncology

## 2022-03-03 VITALS — BP 142/77 | HR 72 | Temp 98.1°F | Resp 18 | Ht 72.0 in | Wt 170.0 lb

## 2022-03-03 DIAGNOSIS — R21 Rash and other nonspecific skin eruption: Secondary | ICD-10-CM | POA: Diagnosis not present

## 2022-03-03 DIAGNOSIS — T451X5A Adverse effect of antineoplastic and immunosuppressive drugs, initial encounter: Secondary | ICD-10-CM | POA: Diagnosis not present

## 2022-03-03 DIAGNOSIS — D539 Nutritional anemia, unspecified: Secondary | ICD-10-CM | POA: Diagnosis not present

## 2022-03-03 DIAGNOSIS — L299 Pruritus, unspecified: Secondary | ICD-10-CM | POA: Diagnosis not present

## 2022-03-03 DIAGNOSIS — Z5111 Encounter for antineoplastic chemotherapy: Secondary | ICD-10-CM | POA: Diagnosis not present

## 2022-03-03 DIAGNOSIS — Z79899 Other long term (current) drug therapy: Secondary | ICD-10-CM | POA: Diagnosis not present

## 2022-03-03 DIAGNOSIS — R5381 Other malaise: Secondary | ICD-10-CM | POA: Diagnosis not present

## 2022-03-03 DIAGNOSIS — C8330 Diffuse large B-cell lymphoma, unspecified site: Secondary | ICD-10-CM | POA: Diagnosis not present

## 2022-03-03 DIAGNOSIS — C8580 Other specified types of non-Hodgkin lymphoma, unspecified site: Secondary | ICD-10-CM

## 2022-03-03 DIAGNOSIS — Z8042 Family history of malignant neoplasm of prostate: Secondary | ICD-10-CM | POA: Diagnosis not present

## 2022-03-03 DIAGNOSIS — Z5112 Encounter for antineoplastic immunotherapy: Secondary | ICD-10-CM | POA: Diagnosis not present

## 2022-03-03 DIAGNOSIS — D75839 Thrombocytosis, unspecified: Secondary | ICD-10-CM | POA: Diagnosis not present

## 2022-03-03 LAB — CBC WITH DIFFERENTIAL (CANCER CENTER ONLY)
Abs Immature Granulocytes: 0 10*3/uL (ref 0.00–0.07)
Basophils Absolute: 0 10*3/uL (ref 0.0–0.1)
Basophils Relative: 1 %
Eosinophils Absolute: 0.2 10*3/uL (ref 0.0–0.5)
Eosinophils Relative: 9 %
HCT: 37 % — ABNORMAL LOW (ref 39.0–52.0)
Hemoglobin: 12.3 g/dL — ABNORMAL LOW (ref 13.0–17.0)
Immature Granulocytes: 0 %
Lymphocytes Relative: 13 %
Lymphs Abs: 0.3 10*3/uL — ABNORMAL LOW (ref 0.7–4.0)
MCH: 32.3 pg (ref 26.0–34.0)
MCHC: 33.2 g/dL (ref 30.0–36.0)
MCV: 97.1 fL (ref 80.0–100.0)
Monocytes Absolute: 0.6 10*3/uL (ref 0.1–1.0)
Monocytes Relative: 28 %
Neutro Abs: 1.1 10*3/uL — ABNORMAL LOW (ref 1.7–7.7)
Neutrophils Relative %: 49 %
Platelet Count: 280 10*3/uL (ref 150–400)
RBC: 3.81 MIL/uL — ABNORMAL LOW (ref 4.22–5.81)
RDW: 14.7 % (ref 11.5–15.5)
WBC Count: 2.2 10*3/uL — ABNORMAL LOW (ref 4.0–10.5)
nRBC: 0 % (ref 0.0–0.2)

## 2022-03-03 LAB — CMP (CANCER CENTER ONLY)
ALT: 18 U/L (ref 0–44)
AST: 19 U/L (ref 15–41)
Albumin: 4.2 g/dL (ref 3.5–5.0)
Alkaline Phosphatase: 52 U/L (ref 38–126)
Anion gap: 5 (ref 5–15)
BUN: 17 mg/dL (ref 8–23)
CO2: 31 mmol/L (ref 22–32)
Calcium: 9.8 mg/dL (ref 8.9–10.3)
Chloride: 104 mmol/L (ref 98–111)
Creatinine: 0.85 mg/dL (ref 0.61–1.24)
GFR, Estimated: >60 mL/min (ref >60)
Glucose, Bld: 104 mg/dL — ABNORMAL HIGH (ref 70–99)
Potassium: 4.9 mmol/L (ref 3.5–5.1)
Sodium: 140 mmol/L (ref 135–145)
Total Bilirubin: 1 mg/dL (ref 0.3–1.2)
Total Protein: 7.2 g/dL (ref 6.5–8.1)

## 2022-03-03 LAB — LACTATE DEHYDROGENASE: LDH: 154 U/L (ref 98–192)

## 2022-03-04 DIAGNOSIS — D3132 Benign neoplasm of left choroid: Secondary | ICD-10-CM | POA: Diagnosis not present

## 2022-03-04 DIAGNOSIS — H3562 Retinal hemorrhage, left eye: Secondary | ICD-10-CM | POA: Diagnosis not present

## 2022-03-04 LAB — IGG: IgG (Immunoglobin G), Serum: 1620 mg/dL — ABNORMAL HIGH (ref 603–1613)

## 2022-03-05 LAB — PROTEIN ELECTROPHORESIS, SERUM
A/G Ratio: 1.4 (ref 0.7–1.7)
Albumin ELP: 3.9 g/dL (ref 2.9–4.4)
Alpha-1-Globulin: 0.2 g/dL (ref 0.0–0.4)
Alpha-2-Globulin: 0.5 g/dL (ref 0.4–1.0)
Beta Globulin: 0.7 g/dL (ref 0.7–1.3)
Gamma Globulin: 1.4 g/dL (ref 0.4–1.8)
Globulin, Total: 2.8 g/dL (ref 2.2–3.9)
M-Spike, %: 1.1 g/dL — ABNORMAL HIGH
Total Protein ELP: 6.7 g/dL (ref 6.0–8.5)

## 2022-03-07 ENCOUNTER — Other Ambulatory Visit: Payer: Self-pay

## 2022-03-07 DIAGNOSIS — C8580 Other specified types of non-Hodgkin lymphoma, unspecified site: Secondary | ICD-10-CM

## 2022-03-08 ENCOUNTER — Other Ambulatory Visit: Payer: Self-pay | Admitting: Oncology

## 2022-03-09 ENCOUNTER — Telehealth: Payer: Self-pay

## 2022-03-09 ENCOUNTER — Other Ambulatory Visit: Payer: Self-pay

## 2022-03-09 NOTE — Telephone Encounter (Signed)
-----   Message from Ladell Pier, MD sent at 03/06/2022  6:34 PM EDT ----- ?Please add serum free light chains to lab from 03/03/2022 ?

## 2022-03-09 NOTE — Telephone Encounter (Signed)
Called Lab not able to add serum free light chains to labs from 03/03/22 ?

## 2022-03-10 ENCOUNTER — Inpatient Hospital Stay: Payer: BC Managed Care – PPO | Attending: Oncology

## 2022-03-10 ENCOUNTER — Encounter: Payer: Self-pay | Admitting: *Deleted

## 2022-03-10 DIAGNOSIS — Z5112 Encounter for antineoplastic immunotherapy: Secondary | ICD-10-CM | POA: Diagnosis not present

## 2022-03-10 DIAGNOSIS — Z5111 Encounter for antineoplastic chemotherapy: Secondary | ICD-10-CM | POA: Insufficient documentation

## 2022-03-10 DIAGNOSIS — R978 Other abnormal tumor markers: Secondary | ICD-10-CM | POA: Insufficient documentation

## 2022-03-10 DIAGNOSIS — Z79899 Other long term (current) drug therapy: Secondary | ICD-10-CM | POA: Diagnosis not present

## 2022-03-10 DIAGNOSIS — C8330 Diffuse large B-cell lymphoma, unspecified site: Secondary | ICD-10-CM | POA: Diagnosis not present

## 2022-03-10 DIAGNOSIS — C8580 Other specified types of non-Hodgkin lymphoma, unspecified site: Secondary | ICD-10-CM

## 2022-03-10 LAB — CMP (CANCER CENTER ONLY)
ALT: 14 U/L (ref 0–44)
AST: 16 U/L (ref 15–41)
Albumin: 4.2 g/dL (ref 3.5–5.0)
Alkaline Phosphatase: 56 U/L (ref 38–126)
Anion gap: 7 (ref 5–15)
BUN: 15 mg/dL (ref 8–23)
CO2: 29 mmol/L (ref 22–32)
Calcium: 9.3 mg/dL (ref 8.9–10.3)
Chloride: 103 mmol/L (ref 98–111)
Creatinine: 0.82 mg/dL (ref 0.61–1.24)
GFR, Estimated: >60 mL/min (ref >60)
Glucose, Bld: 100 mg/dL — ABNORMAL HIGH (ref 70–99)
Potassium: 5 mmol/L (ref 3.5–5.1)
Sodium: 139 mmol/L (ref 135–145)
Total Bilirubin: 1 mg/dL (ref 0.3–1.2)
Total Protein: 7.1 g/dL (ref 6.5–8.1)

## 2022-03-10 LAB — CBC WITH DIFFERENTIAL (CANCER CENTER ONLY)
Abs Immature Granulocytes: 0 10*3/uL (ref 0.00–0.07)
Basophils Absolute: 0 10*3/uL (ref 0.0–0.1)
Basophils Relative: 1 %
Eosinophils Absolute: 0.2 10*3/uL (ref 0.0–0.5)
Eosinophils Relative: 9 %
HCT: 37.7 % — ABNORMAL LOW (ref 39.0–52.0)
Hemoglobin: 12.6 g/dL — ABNORMAL LOW (ref 13.0–17.0)
Immature Granulocytes: 0 %
Lymphocytes Relative: 15 %
Lymphs Abs: 0.3 10*3/uL — ABNORMAL LOW (ref 0.7–4.0)
MCH: 32.5 pg (ref 26.0–34.0)
MCHC: 33.4 g/dL (ref 30.0–36.0)
MCV: 97.2 fL (ref 80.0–100.0)
Monocytes Absolute: 0.6 10*3/uL (ref 0.1–1.0)
Monocytes Relative: 26 %
Neutro Abs: 1 10*3/uL — ABNORMAL LOW (ref 1.7–7.7)
Neutrophils Relative %: 49 %
Platelet Count: 237 10*3/uL (ref 150–400)
RBC: 3.88 MIL/uL — ABNORMAL LOW (ref 4.22–5.81)
RDW: 14.9 % (ref 11.5–15.5)
WBC Count: 2.1 10*3/uL — ABNORMAL LOW (ref 4.0–10.5)
nRBC: 0 % (ref 0.0–0.2)

## 2022-03-10 NOTE — Progress Notes (Signed)
Patient called with lab results and per Dr [name] we will postpone cycle 4 until next week and will reschedule Bone marrow biopsy for 1 month.  Will reschedule and call with patient with updated calendar. ? ? ?

## 2022-03-11 ENCOUNTER — Inpatient Hospital Stay: Payer: BC Managed Care – PPO

## 2022-03-11 LAB — KAPPA/LAMBDA LIGHT CHAINS
Kappa free light chain: 63.1 mg/L — ABNORMAL HIGH (ref 3.3–19.4)
Kappa, lambda light chain ratio: 19.72 — ABNORMAL HIGH (ref 0.26–1.65)
Lambda free light chains: 3.2 mg/L — ABNORMAL LOW (ref 5.7–26.3)

## 2022-03-11 LAB — BETA 2 MICROGLOBULIN, SERUM: Beta-2 Microglobulin: 1.8 mg/L (ref 0.6–2.4)

## 2022-03-12 ENCOUNTER — Inpatient Hospital Stay: Payer: BC Managed Care – PPO

## 2022-03-16 ENCOUNTER — Other Ambulatory Visit: Payer: Self-pay

## 2022-03-16 DIAGNOSIS — C8580 Other specified types of non-Hodgkin lymphoma, unspecified site: Secondary | ICD-10-CM

## 2022-03-17 ENCOUNTER — Other Ambulatory Visit: Payer: BC Managed Care – PPO

## 2022-03-18 ENCOUNTER — Inpatient Hospital Stay: Payer: BC Managed Care – PPO

## 2022-03-18 DIAGNOSIS — Z5112 Encounter for antineoplastic immunotherapy: Secondary | ICD-10-CM | POA: Diagnosis not present

## 2022-03-18 DIAGNOSIS — Z5111 Encounter for antineoplastic chemotherapy: Secondary | ICD-10-CM | POA: Diagnosis not present

## 2022-03-18 DIAGNOSIS — C8330 Diffuse large B-cell lymphoma, unspecified site: Secondary | ICD-10-CM | POA: Diagnosis not present

## 2022-03-18 DIAGNOSIS — R978 Other abnormal tumor markers: Secondary | ICD-10-CM | POA: Diagnosis not present

## 2022-03-18 DIAGNOSIS — C8580 Other specified types of non-Hodgkin lymphoma, unspecified site: Secondary | ICD-10-CM

## 2022-03-18 DIAGNOSIS — Z79899 Other long term (current) drug therapy: Secondary | ICD-10-CM | POA: Diagnosis not present

## 2022-03-18 LAB — CBC WITH DIFFERENTIAL (CANCER CENTER ONLY)
Abs Immature Granulocytes: 0 10*3/uL (ref 0.00–0.07)
Basophils Absolute: 0 10*3/uL (ref 0.0–0.1)
Basophils Relative: 1 %
Eosinophils Absolute: 0.1 10*3/uL (ref 0.0–0.5)
Eosinophils Relative: 5 %
HCT: 37.1 % — ABNORMAL LOW (ref 39.0–52.0)
Hemoglobin: 12.6 g/dL — ABNORMAL LOW (ref 13.0–17.0)
Immature Granulocytes: 0 %
Lymphocytes Relative: 12 %
Lymphs Abs: 0.3 10*3/uL — ABNORMAL LOW (ref 0.7–4.0)
MCH: 33.1 pg (ref 26.0–34.0)
MCHC: 34 g/dL (ref 30.0–36.0)
MCV: 97.4 fL (ref 80.0–100.0)
Monocytes Absolute: 0.6 10*3/uL (ref 0.1–1.0)
Monocytes Relative: 19 %
Neutro Abs: 1.8 10*3/uL (ref 1.7–7.7)
Neutrophils Relative %: 63 %
Platelet Count: 193 10*3/uL (ref 150–400)
RBC: 3.81 MIL/uL — ABNORMAL LOW (ref 4.22–5.81)
RDW: 14.6 % (ref 11.5–15.5)
WBC Count: 2.8 10*3/uL — ABNORMAL LOW (ref 4.0–10.5)
nRBC: 0 % (ref 0.0–0.2)

## 2022-03-18 LAB — CMP (CANCER CENTER ONLY)
ALT: 18 U/L (ref 0–44)
AST: 20 U/L (ref 15–41)
Albumin: 4.1 g/dL (ref 3.5–5.0)
Alkaline Phosphatase: 45 U/L (ref 38–126)
Anion gap: 6 (ref 5–15)
BUN: 16 mg/dL (ref 8–23)
CO2: 29 mmol/L (ref 22–32)
Calcium: 9.2 mg/dL (ref 8.9–10.3)
Chloride: 105 mmol/L (ref 98–111)
Creatinine: 0.85 mg/dL (ref 0.61–1.24)
GFR, Estimated: >60 mL/min (ref >60)
Glucose, Bld: 90 mg/dL (ref 70–99)
Potassium: 4.3 mmol/L (ref 3.5–5.1)
Sodium: 140 mmol/L (ref 135–145)
Total Bilirubin: 1.2 mg/dL (ref 0.3–1.2)
Total Protein: 6.9 g/dL (ref 6.5–8.1)

## 2022-03-19 ENCOUNTER — Ambulatory Visit: Payer: BC Managed Care – PPO

## 2022-03-19 ENCOUNTER — Ambulatory Visit (HOSPITAL_COMMUNITY): Payer: BC Managed Care – PPO

## 2022-03-19 ENCOUNTER — Other Ambulatory Visit: Payer: Self-pay | Admitting: Oncology

## 2022-03-19 ENCOUNTER — Inpatient Hospital Stay: Payer: BC Managed Care – PPO

## 2022-03-19 VITALS — BP 128/82 | HR 78 | Temp 98.0°F | Resp 16 | Wt 172.4 lb

## 2022-03-19 DIAGNOSIS — C8580 Other specified types of non-Hodgkin lymphoma, unspecified site: Secondary | ICD-10-CM

## 2022-03-19 DIAGNOSIS — Z79899 Other long term (current) drug therapy: Secondary | ICD-10-CM | POA: Diagnosis not present

## 2022-03-19 DIAGNOSIS — Z5111 Encounter for antineoplastic chemotherapy: Secondary | ICD-10-CM | POA: Diagnosis not present

## 2022-03-19 DIAGNOSIS — C8330 Diffuse large B-cell lymphoma, unspecified site: Secondary | ICD-10-CM | POA: Diagnosis not present

## 2022-03-19 DIAGNOSIS — R978 Other abnormal tumor markers: Secondary | ICD-10-CM | POA: Diagnosis not present

## 2022-03-19 DIAGNOSIS — Z5112 Encounter for antineoplastic immunotherapy: Secondary | ICD-10-CM | POA: Diagnosis not present

## 2022-03-19 DIAGNOSIS — C8515 Unspecified B-cell lymphoma, lymph nodes of inguinal region and lower limb: Secondary | ICD-10-CM

## 2022-03-19 MED ORDER — DIPHENHYDRAMINE HCL 50 MG/ML IJ SOLN
25.0000 mg | Freq: Once | INTRAMUSCULAR | Status: AC
  Administered 2022-03-19: 25 mg via INTRAVENOUS
  Filled 2022-03-19: qty 1

## 2022-03-19 MED ORDER — SODIUM CHLORIDE 0.9 % IV SOLN
10.0000 mg | Freq: Once | INTRAVENOUS | Status: AC
  Administered 2022-03-19: 10 mg via INTRAVENOUS
  Filled 2022-03-19: qty 1

## 2022-03-19 MED ORDER — PALONOSETRON HCL INJECTION 0.25 MG/5ML
0.2500 mg | Freq: Once | INTRAVENOUS | Status: AC
  Administered 2022-03-19: 0.25 mg via INTRAVENOUS
  Filled 2022-03-19: qty 5

## 2022-03-19 MED ORDER — METHYLPREDNISOLONE SODIUM SUCC 125 MG IJ SOLR
60.0000 mg | Freq: Once | INTRAMUSCULAR | Status: AC
  Administered 2022-03-19: 60 mg via INTRAVENOUS

## 2022-03-19 MED ORDER — SODIUM CHLORIDE 0.9 % IV SOLN: Freq: Once | INTRAVENOUS | Status: AC

## 2022-03-19 MED ORDER — SODIUM CHLORIDE 0.9 % IV SOLN
90.0000 mg/m2 | Freq: Once | INTRAVENOUS | Status: AC
  Administered 2022-03-19: 175 mg via INTRAVENOUS
  Filled 2022-03-19: qty 7

## 2022-03-19 MED ORDER — ACETAMINOPHEN 325 MG PO TABS
650.0000 mg | ORAL_TABLET | Freq: Once | ORAL | Status: AC
  Administered 2022-03-19: 650 mg via ORAL
  Filled 2022-03-19: qty 2

## 2022-03-19 MED ORDER — SODIUM CHLORIDE 0.9 % IV SOLN
375.0000 mg/m2 | Freq: Once | INTRAVENOUS | Status: AC
  Administered 2022-03-19: 800 mg via INTRAVENOUS
  Filled 2022-03-19: qty 50

## 2022-03-19 MED ORDER — FAMOTIDINE IN NACL 20-0.9 MG/50ML-% IV SOLN
20.0000 mg | Freq: Once | INTRAVENOUS | Status: AC
  Administered 2022-03-19: 20 mg via INTRAVENOUS
  Filled 2022-03-19: qty 50

## 2022-03-19 NOTE — Progress Notes (Signed)
Hypersensitivity Reaction note ? ?Date of event: 03/19/22 ?Time of event: 1200 ?Generic name of drug involved: Rituximab ?Name of provider notified of the hypersensitivity reaction: Dr [name] ?Was agent that likely caused hypersensitivity reaction added to Allergies List within EMR? Medication already listed no update needed. ?Chain of events including reaction signs/symptoms, treatment administered, and outcome (e.g., drug resumed; drug discontinued; sent to Emergency Department; etc.)  ?1200 patient complained of widespread itching, redness on abdomen and nasal congestion. Rituximab stopped immediately, scheduled benadryl 25 mg given at this time. vital signs taken. Dr [name] notified. Denied shortness of breath or chest tightness. Denied difficulty swallowing or breathing.  ?IV access maintained with normal saline. ?1220 patient reported improved itching now at baseline, nasal congestion greatly improved and red rash on abdomen improved. ?2703 restarted at reduced rate of 41 ml/ hour.  ?1313 patient requested remain at same rate due to continued itching, no worsening of itching, congestion improved. ?1338 patient complained of worsening itching. Abdominal redness more widespread. Stated that this is worse that last treatment. Infusion stopped and Dr [name] notified. Verbal order received for solu medrol. See MAR.  ?1345 solumedrol given as ordered. ?53 Dr [name] at chairside. Per verbal order, Rituximab restarted at 41 ml/hr. Per verbal order, to receive 2 hours at 100 mg/hour and then to receive bendamustine. Will complete Rituximab tomorrow.  ?Patient able to tolerate remainder of infusion without any further issues. ?Jerold Coombe, RN ?03/19/2022 1:32 PM ? ?

## 2022-03-19 NOTE — Progress Notes (Signed)
Patient presents for treatment. RN assessment completed along with the following: ? ?Labs/vitals reviewed - Yes, and within treatment parameters.   ?Weight within 10% of previous measurement - Yes ?Informed consent completed and reflects current therapy/intent - Yes, on date 12/18/2021             ?Provider progress note reviewed - Patient not seen by provider today. Most recent note dated 03/03/2022 reviewed. ?Treatment/Antibody/Supportive plan reviewed - Yes, and there are no adjustments needed for today's treatment. ?S&H and other orders reviewed - Yes, and there are no additional orders identified. ?Previous treatment date reviewed - Yes, and the appropriate amount of time has elapsed between treatments. ?Clinic Hand Off Received from - no ? ?Patient to proceed with treatment.   ? ? ?

## 2022-03-19 NOTE — Patient Instructions (Signed)
[facility]   ?Discharge Instructions: ?Thank you for choosing [facility] to provide your oncology and hematology care.  ? ?If you have a lab appointment with the [facility], please go directly to the [facility] and check in at the registration area. ?  ?Wear comfortable clothing and clothing appropriate for easy access to any Portacath or PICC line.  ? ?We strive to give you quality time with your provider. You may need to reschedule your appointment if you arrive late (15 or more minutes).  Arriving late affects you and other patients whose appointments are after yours.  Also, if you miss three or more appointments without notifying the office, you may be dismissed from the clinic at the provider?s discretion.    ?  ?For prescription refill requests, have your pharmacy contact our office and allow 72 hours for refills to be completed.   ? ?Today you received the following chemotherapy and/or immunotherapy agents Rituximab-pvvr (RUXIENCE) & Bendamustine (BENDEKA).    ?  ?To help prevent nausea and vomiting after your treatment, we encourage you to take your nausea medication as directed. ? ?BELOW ARE SYMPTOMS THAT SHOULD BE REPORTED IMMEDIATELY: ?*FEVER GREATER THAN 100.4 F (38 ?C) OR HIGHER ?*CHILLS OR SWEATING ?*NAUSEA AND VOMITING THAT IS NOT CONTROLLED WITH YOUR NAUSEA MEDICATION ?*UNUSUAL SHORTNESS OF BREATH ?*UNUSUAL BRUISING OR BLEEDING ?*URINARY PROBLEMS (pain or burning when urinating, or frequent urination) ?*BOWEL PROBLEMS (unusual diarrhea, constipation, pain near the anus) ?TENDERNESS IN MOUTH AND THROAT WITH OR WITHOUT PRESENCE OF ULCERS (sore throat, sores in mouth, or a toothache) ?UNUSUAL RASH, SWELLING OR PAIN  ?UNUSUAL VAGINAL DISCHARGE OR ITCHING  ? ?Items with * indicate a potential emergency and should be followed up as soon as possible or go to the Emergency Department if any problems should occur. ? ?Please show the CHEMOTHERAPY ALERT CARD or  IMMUNOTHERAPY ALERT CARD at check-in to the Emergency Department and triage nurse. ? ?Should you have questions after your visit or need to cancel or reschedule your appointment, please contact [facility]  Dept: [phone]  and follow the prompts.  Office hours are 8:00 a.m. to 4:30 p.m. Monday - Friday. Please note that voicemails left after 4:00 p.m. may not be returned until the following business day.  We are closed weekends and major holidays. You have access to a nurse at all times for urgent questions. Please call the main number to the clinic Dept: [phone] and follow the prompts. ? ? ?For any non-urgent questions, you may also contact your provider using MyChart. We now offer e-Visits for anyone 9 and older to request care online for non-urgent symptoms. For details visit [email or web address]. ?  ?Also download the MyChart app! Go to the app store, search "MyChart", open the app, select [facility], and log in with your MyChart username and password. ? ?Due to Covid, a mask is required upon entering the hospital/clinic. If you do not have a mask, one will be given to you upon arrival. For doctor visits, patients may have 1 support person aged 27 or older with them. For treatment visits, patients cannot have anyone with them due to current Covid guidelines and our immunocompromised population.  ? ?Rituximab Injection ?What is this medication? ?RITUXIMAB (ri TUX i mab) is a monoclonal antibody. It is used to treat certain types of cancer like non-Hodgkin lymphoma and chronic lymphocytic leukemia. It is also used to treat rheumatoid arthritis, granulomatosis with polyangiitis, microscopic polyangiitis,  and pemphigus vulgaris. ?This medicine may be used for other purposes; ask your health care provider or pharmacist if you have questions. ?COMMON BRAND NAME(S): RIABNI, Rituxan, RUXIENCE ?What should I tell my care team before I take this medication? ?They need to know if  you have any of these conditions: ?chest pain ?heart disease ?infection especially a viral infection such as chickenpox, cold sores, hepatitis B, or herpes ?immune system problems ?irregular heartbeat or rhythm ?kidney disease ?low blood counts (white cells, platelets, or red cells) ?lung disease ?recent or upcoming vaccine ?an unusual or allergic reaction to rituximab, other medicines, foods, dyes, or preservatives ?pregnant or trying to get pregnant ?breast-feeding ?How should I use this medication? ?This medicine is injected into a vein. It is given by a health care provider in a hospital or clinic setting. ?A special MedGuide will be given to you before each treatment. Be sure to read this information carefully each time. ?Talk to your health care provider about the use of this medicine in children. While this drug may be prescribed for children as young as 6 months for selected conditions, precautions do apply. ?Overdosage: If you think you have taken too much of this medicine contact a poison control center or emergency room at once. ?NOTE: This medicine is only for you. Do not share this medicine with others. ?What if I miss a dose? ?Keep appointments for follow-up doses. It is important not to miss your dose. Call your health care provider if you are unable to keep an appointment. ?What may interact with this medication? ?Do not take this medicine with any of the following medicines: ?live vaccines ?This medicine may also interact with the following medicines: ?cisplatin ?This list may not describe all possible interactions. Give your health care provider a list of all the medicines, herbs, non-prescription drugs, or dietary supplements you use. Also tell them if you smoke, drink alcohol, or use illegal drugs. Some items may interact with your medicine. ?What should I watch for while using this medication? ?Your condition will be monitored carefully while you are receiving this medicine. You may need blood  work done while you are taking this medicine. ?This medicine can cause serious infusion reactions. To reduce the risk your health care provider may give you other medicines to take before receiving this one. Be sure to follow the directions from your health care provider. ?This medicine may increase your risk of getting an infection. Call your health care provider for advice if you get a fever, chills, sore throat, or other symptoms of a cold or flu. Do not treat yourself. Try to avoid being around people who are sick. ?Call your health care provider if you are around anyone with measles, chickenpox, or if you develop sores or blisters that do not heal properly. ?Avoid taking medicines that contain aspirin, acetaminophen, ibuprofen, naproxen, or ketoprofen unless instructed by your health care provider. These medicines may hide a fever. ?This medicine may cause serious skin reactions. They can happen weeks to months after starting the medicine. Contact your health care provider right away if you notice fevers or flu-like symptoms with a rash. The rash may be red or purple and then turn into blisters or peeling of the skin. Or, you might notice a red rash with swelling of the face, lips or lymph nodes in your neck or under your arms. ?In some patients, this medicine may cause a serious brain infection that may cause death. If you have any problems seeing, thinking, speaking,  walking, or standing, tell your healthcare professional right away. If you cannot reach your healthcare professional, urgently seek other source of medical care. ?Do not become pregnant while taking this medicine or for at least 12 months after stopping it. Women should inform their health care provider if they wish to become pregnant or think they might be pregnant. There is potential for serious harm to an unborn child. Talk to your health care provider for more information. Women should use a reliable form of birth control while taking this  medicine and for 12 months after stopping it. Do not breast-feed while taking this medicine or for at least 6 months after stopping it. ?What side effects may I notice from receiving this medication? ?Side

## 2022-03-20 ENCOUNTER — Inpatient Hospital Stay: Payer: BC Managed Care – PPO

## 2022-03-20 ENCOUNTER — Other Ambulatory Visit: Payer: Self-pay | Admitting: Oncology

## 2022-03-20 VITALS — BP 135/79 | HR 72 | Temp 98.0°F | Resp 18

## 2022-03-20 DIAGNOSIS — C8580 Other specified types of non-Hodgkin lymphoma, unspecified site: Secondary | ICD-10-CM

## 2022-03-20 DIAGNOSIS — C8330 Diffuse large B-cell lymphoma, unspecified site: Secondary | ICD-10-CM | POA: Diagnosis not present

## 2022-03-20 DIAGNOSIS — Z5111 Encounter for antineoplastic chemotherapy: Secondary | ICD-10-CM | POA: Diagnosis not present

## 2022-03-20 DIAGNOSIS — Z5112 Encounter for antineoplastic immunotherapy: Secondary | ICD-10-CM | POA: Diagnosis not present

## 2022-03-20 DIAGNOSIS — R978 Other abnormal tumor markers: Secondary | ICD-10-CM | POA: Diagnosis not present

## 2022-03-20 DIAGNOSIS — Z79899 Other long term (current) drug therapy: Secondary | ICD-10-CM | POA: Diagnosis not present

## 2022-03-20 MED ORDER — FAMOTIDINE IN NACL 20-0.9 MG/50ML-% IV SOLN
20.0000 mg | Freq: Once | INTRAVENOUS | Status: AC
  Administered 2022-03-20: 20 mg via INTRAVENOUS
  Filled 2022-03-20: qty 50

## 2022-03-20 MED ORDER — SODIUM CHLORIDE 0.9 % IV SOLN
90.0000 mg/m2 | Freq: Once | INTRAVENOUS | Status: AC
  Administered 2022-03-20: 175 mg via INTRAVENOUS
  Filled 2022-03-20: qty 7

## 2022-03-20 MED ORDER — ACETAMINOPHEN 325 MG PO TABS
650.0000 mg | ORAL_TABLET | Freq: Once | ORAL | Status: AC
  Administered 2022-03-20: 650 mg via ORAL
  Filled 2022-03-20: qty 2

## 2022-03-20 MED ORDER — METHYLPREDNISOLONE SODIUM SUCC 125 MG IJ SOLR
125.0000 mg | Freq: Once | INTRAMUSCULAR | Status: AC
  Administered 2022-03-20: 125 mg via INTRAVENOUS
  Filled 2022-03-20: qty 2

## 2022-03-20 MED ORDER — PALONOSETRON HCL INJECTION 0.25 MG/5ML
0.2500 mg | Freq: Once | INTRAVENOUS | Status: AC
  Administered 2022-03-20: 0.25 mg via INTRAVENOUS
  Filled 2022-03-20: qty 5

## 2022-03-20 MED ORDER — SODIUM CHLORIDE 0.9 % IV SOLN
400.0000 mg | Freq: Once | INTRAVENOUS | Status: AC
  Administered 2022-03-20: 400 mg via INTRAVENOUS
  Filled 2022-03-20: qty 40

## 2022-03-20 MED ORDER — DIPHENHYDRAMINE HCL 50 MG/ML IJ SOLN
25.0000 mg | Freq: Once | INTRAMUSCULAR | Status: AC
  Administered 2022-03-20: 25 mg via INTRAVENOUS
  Filled 2022-03-20: qty 1

## 2022-03-20 MED ORDER — SODIUM CHLORIDE 0.9 % IV SOLN: Freq: Once | INTRAVENOUS | Status: AC

## 2022-03-20 NOTE — Patient Instructions (Signed)
[facility]   ?Discharge Instructions: ?Thank you for choosing [facility] to provide your oncology and hematology care.  ? ?If you have a lab appointment with the [facility], please go directly to the [facility] and check in at the registration area. ?  ?Wear comfortable clothing and clothing appropriate for easy access to any Portacath or PICC line.  ? ?We strive to give you quality time with your provider. You may need to reschedule your appointment if you arrive late (15 or more minutes).  Arriving late affects you and other patients whose appointments are after yours.  Also, if you miss three or more appointments without notifying the office, you may be dismissed from the clinic at the provider?s discretion.    ?  ?For prescription refill requests, have your pharmacy contact our office and allow 72 hours for refills to be completed.   ? ?Today you received the following chemotherapy and/or immunotherapy agents Rituximab-pvvr (RUXIENCE) & Bendamustine (BENDEKA).    ?  ?To help prevent nausea and vomiting after your treatment, we encourage you to take your nausea medication as directed. ? ?BELOW ARE SYMPTOMS THAT SHOULD BE REPORTED IMMEDIATELY: ?*FEVER GREATER THAN 100.4 F (38 ?C) OR HIGHER ?*CHILLS OR SWEATING ?*NAUSEA AND VOMITING THAT IS NOT CONTROLLED WITH YOUR NAUSEA MEDICATION ?*UNUSUAL SHORTNESS OF BREATH ?*UNUSUAL BRUISING OR BLEEDING ?*URINARY PROBLEMS (pain or burning when urinating, or frequent urination) ?*BOWEL PROBLEMS (unusual diarrhea, constipation, pain near the anus) ?TENDERNESS IN MOUTH AND THROAT WITH OR WITHOUT PRESENCE OF ULCERS (sore throat, sores in mouth, or a toothache) ?UNUSUAL RASH, SWELLING OR PAIN  ?UNUSUAL VAGINAL DISCHARGE OR ITCHING  ? ?Items with * indicate a potential emergency and should be followed up as soon as possible or go to the Emergency Department if any problems should occur. ? ?Please show the CHEMOTHERAPY ALERT CARD or  IMMUNOTHERAPY ALERT CARD at check-in to the Emergency Department and triage nurse. ? ?Should you have questions after your visit or need to cancel or reschedule your appointment, please contact [facility]  Dept: [phone]  and follow the prompts.  Office hours are 8:00 a.m. to 4:30 p.m. Monday - Friday. Please note that voicemails left after 4:00 p.m. may not be returned until the following business day.  We are closed weekends and major holidays. You have access to a nurse at all times for urgent questions. Please call the main number to the clinic Dept: [phone] and follow the prompts. ? ? ?For any non-urgent questions, you may also contact your provider using MyChart. We now offer e-Visits for anyone 4 and older to request care online for non-urgent symptoms. For details visit [email or web address]. ?  ?Also download the MyChart app! Go to the app store, search "MyChart", open the app, select [facility], and log in with your MyChart username and password. ? ?Due to Covid, a mask is required upon entering the hospital/clinic. If you do not have a mask, one will be given to you upon arrival. For doctor visits, patients may have 1 support person aged 45 or older with them. For treatment visits, patients cannot have anyone with them due to current Covid guidelines and our immunocompromised population.  ? ?Rituximab Injection ?What is this medication? ?RITUXIMAB (ri TUX i mab) is a monoclonal antibody. It is used to treat certain types of cancer like non-Hodgkin lymphoma and chronic lymphocytic leukemia. It is also used to treat rheumatoid arthritis, granulomatosis with polyangiitis, microscopic polyangiitis,  and pemphigus vulgaris. ?This medicine may be used for other purposes; ask your health care provider or pharmacist if you have questions. ?COMMON BRAND NAME(S): RIABNI, Rituxan, RUXIENCE ?What should I tell my care team before I take this medication? ?They need to know if  you have any of these conditions: ?chest pain ?heart disease ?infection especially a viral infection such as chickenpox, cold sores, hepatitis B, or herpes ?immune system problems ?irregular heartbeat or rhythm ?kidney disease ?low blood counts (white cells, platelets, or red cells) ?lung disease ?recent or upcoming vaccine ?an unusual or allergic reaction to rituximab, other medicines, foods, dyes, or preservatives ?pregnant or trying to get pregnant ?breast-feeding ?How should I use this medication? ?This medicine is injected into a vein. It is given by a health care provider in a hospital or clinic setting. ?A special MedGuide will be given to you before each treatment. Be sure to read this information carefully each time. ?Talk to your health care provider about the use of this medicine in children. While this drug may be prescribed for children as young as 6 months for selected conditions, precautions do apply. ?Overdosage: If you think you have taken too much of this medicine contact a poison control center or emergency room at once. ?NOTE: This medicine is only for you. Do not share this medicine with others. ?What if I miss a dose? ?Keep appointments for follow-up doses. It is important not to miss your dose. Call your health care provider if you are unable to keep an appointment. ?What may interact with this medication? ?Do not take this medicine with any of the following medicines: ?live vaccines ?This medicine may also interact with the following medicines: ?cisplatin ?This list may not describe all possible interactions. Give your health care provider a list of all the medicines, herbs, non-prescription drugs, or dietary supplements you use. Also tell them if you smoke, drink alcohol, or use illegal drugs. Some items may interact with your medicine. ?What should I watch for while using this medication? ?Your condition will be monitored carefully while you are receiving this medicine. You may need blood  work done while you are taking this medicine. ?This medicine can cause serious infusion reactions. To reduce the risk your health care provider may give you other medicines to take before receiving this one. Be sure to follow the directions from your health care provider. ?This medicine may increase your risk of getting an infection. Call your health care provider for advice if you get a fever, chills, sore throat, or other symptoms of a cold or flu. Do not treat yourself. Try to avoid being around people who are sick. ?Call your health care provider if you are around anyone with measles, chickenpox, or if you develop sores or blisters that do not heal properly. ?Avoid taking medicines that contain aspirin, acetaminophen, ibuprofen, naproxen, or ketoprofen unless instructed by your health care provider. These medicines may hide a fever. ?This medicine may cause serious skin reactions. They can happen weeks to months after starting the medicine. Contact your health care provider right away if you notice fevers or flu-like symptoms with a rash. The rash may be red or purple and then turn into blisters or peeling of the skin. Or, you might notice a red rash with swelling of the face, lips or lymph nodes in your neck or under your arms. ?In some patients, this medicine may cause a serious brain infection that may cause death. If you have any problems seeing, thinking, speaking,  walking, or standing, tell your healthcare professional right away. If you cannot reach your healthcare professional, urgently seek other source of medical care. ?Do not become pregnant while taking this medicine or for at least 12 months after stopping it. Women should inform their health care provider if they wish to become pregnant or think they might be pregnant. There is potential for serious harm to an unborn child. Talk to your health care provider for more information. Women should use a reliable form of birth control while taking this  medicine and for 12 months after stopping it. Do not breast-feed while taking this medicine or for at least 6 months after stopping it. ?What side effects may I notice from receiving this medication? ?Side

## 2022-03-20 NOTE — Progress Notes (Signed)
Patient presents for treatment. RN assessment completed along with the following: ? ?Labs/vitals reviewed - Yes, and within treatment parameters.   ?Weight within 10% of previous measurement - Yes ?Informed consent completed and reflects current therapy/intent - Yes, on date 12/18/21             ?Provider progress note reviewed - Patient not seen by provider today. Most recent note dated 03/03/22 reviewed. ?Treatment/Antibody/Supportive plan reviewed - Yes, and rituximab and premeds have been added to original plan. Patient was unable to complete rituxan infusion on 03/19/22, will complete remaining dose today.  ?S&H and other orders reviewed - Yes, and there are no additional orders identified. ?Previous treatment date reviewed - Yes, and the appropriate amount of time has elapsed between treatments. ? ?Patient to proceed with treatment.   ? ?Rituximab titrated to '100mg'$ /hr maximum rate as this was the rate patient tolerated on 03/19/22.  Patient able to tolerate infusion without any issues.  ?

## 2022-03-20 NOTE — Progress Notes (Signed)
Per MD, patient only received half of infusion. Provider will give 400 mg Ruxience on day 2 with Bendeka. ?

## 2022-03-27 ENCOUNTER — Other Ambulatory Visit: Payer: Self-pay

## 2022-03-27 DIAGNOSIS — C8580 Other specified types of non-Hodgkin lymphoma, unspecified site: Secondary | ICD-10-CM

## 2022-04-01 ENCOUNTER — Telehealth: Payer: Self-pay | Admitting: *Deleted

## 2022-04-01 NOTE — Telephone Encounter (Signed)
Scheduled for OV on 5/25 and BMBX not scheduled until 04/21/22. Left message asking if 6/13 was 1st available or was his preference to wait. Does he need to see MD tomorrow or does he wish to move out till after bx? Requested return call.

## 2022-04-02 ENCOUNTER — Inpatient Hospital Stay: Payer: BC Managed Care – PPO

## 2022-04-02 ENCOUNTER — Inpatient Hospital Stay: Payer: BC Managed Care – PPO | Admitting: Oncology

## 2022-04-02 VITALS — BP 132/85 | HR 75 | Temp 98.6°F | Resp 18 | Ht 72.0 in | Wt 172.6 lb

## 2022-04-02 DIAGNOSIS — C8330 Diffuse large B-cell lymphoma, unspecified site: Secondary | ICD-10-CM | POA: Diagnosis not present

## 2022-04-02 DIAGNOSIS — C8580 Other specified types of non-Hodgkin lymphoma, unspecified site: Secondary | ICD-10-CM

## 2022-04-02 DIAGNOSIS — Z5112 Encounter for antineoplastic immunotherapy: Secondary | ICD-10-CM | POA: Diagnosis not present

## 2022-04-02 DIAGNOSIS — R978 Other abnormal tumor markers: Secondary | ICD-10-CM | POA: Diagnosis not present

## 2022-04-02 DIAGNOSIS — Z5111 Encounter for antineoplastic chemotherapy: Secondary | ICD-10-CM | POA: Diagnosis not present

## 2022-04-02 DIAGNOSIS — C8515 Unspecified B-cell lymphoma, lymph nodes of inguinal region and lower limb: Secondary | ICD-10-CM

## 2022-04-02 DIAGNOSIS — Z79899 Other long term (current) drug therapy: Secondary | ICD-10-CM | POA: Diagnosis not present

## 2022-04-02 LAB — CBC WITH DIFFERENTIAL (CANCER CENTER ONLY)
Abs Immature Granulocytes: 0.01 10*3/uL (ref 0.00–0.07)
Basophils Absolute: 0 10*3/uL (ref 0.0–0.1)
Basophils Relative: 1 %
Eosinophils Absolute: 0.2 10*3/uL (ref 0.0–0.5)
Eosinophils Relative: 7 %
HCT: 34.8 % — ABNORMAL LOW (ref 39.0–52.0)
Hemoglobin: 12.1 g/dL — ABNORMAL LOW (ref 13.0–17.0)
Immature Granulocytes: 0 %
Lymphocytes Relative: 9 %
Lymphs Abs: 0.3 10*3/uL — ABNORMAL LOW (ref 0.7–4.0)
MCH: 33.5 pg (ref 26.0–34.0)
MCHC: 34.8 g/dL (ref 30.0–36.0)
MCV: 96.4 fL (ref 80.0–100.0)
Monocytes Absolute: 0.5 10*3/uL (ref 0.1–1.0)
Monocytes Relative: 16 %
Neutro Abs: 2.1 10*3/uL (ref 1.7–7.7)
Neutrophils Relative %: 67 %
Platelet Count: 266 10*3/uL (ref 150–400)
RBC: 3.61 MIL/uL — ABNORMAL LOW (ref 4.22–5.81)
RDW: 14.4 % (ref 11.5–15.5)
WBC Count: 3.1 10*3/uL — ABNORMAL LOW (ref 4.0–10.5)
nRBC: 0 % (ref 0.0–0.2)

## 2022-04-02 LAB — CMP (CANCER CENTER ONLY)
ALT: 11 U/L (ref 0–44)
AST: 16 U/L (ref 15–41)
Albumin: 4.2 g/dL (ref 3.5–5.0)
Alkaline Phosphatase: 48 U/L (ref 38–126)
Anion gap: 7 (ref 5–15)
BUN: 18 mg/dL (ref 8–23)
CO2: 27 mmol/L (ref 22–32)
Calcium: 8.8 mg/dL — ABNORMAL LOW (ref 8.9–10.3)
Chloride: 104 mmol/L (ref 98–111)
Creatinine: 0.87 mg/dL (ref 0.61–1.24)
GFR, Estimated: >60 mL/min (ref >60)
Glucose, Bld: 128 mg/dL — ABNORMAL HIGH (ref 70–99)
Potassium: 4.3 mmol/L (ref 3.5–5.1)
Sodium: 138 mmol/L (ref 135–145)
Total Bilirubin: 1.1 mg/dL (ref 0.3–1.2)
Total Protein: 6.9 g/dL (ref 6.5–8.1)

## 2022-04-02 NOTE — Progress Notes (Signed)
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  He again developed symptoms of an allergic reaction during the rituximab infusion.  He developed pruritus, nasal congestion, and a rash.  He completed part of the rituximab infusion on 03/19/2022 after receiving additional Solu-Medrol.  He was able to complete the remainder of the rituximab infusion on 03/20/2022 without an allergic reaction.  He reports malaise following chemotherapy.  This is improving.  Mild nausea following chemotherapy.  No mouth sores.  The palpable lymph nodes are smaller  Objective:  Vital signs in last 24 hours:  Blood pressure 132/85, pulse 75, temperature 98.6 F (37 C), temperature source Oral, resp. rate 18, height 6' (1.829 m), weight 172 lb 9.6 oz (78.3 kg), SpO2 99 %.    HEENT: No thrush or ulcers Lymphatics: No cervical or supraclavicular nodes.  1/2-1 cm left axillary node, less than 1 cm right axillary node versus a slightly prominent fat pad, pea-sized left inguinal node.  Less than 1 cm right inguinal node-difficult to distinguish small firm lymph nodes versus benign soft tissue structures Resp: Lungs clear bilaterally Cardio: Regular rate and rhythm GI: No hepatosplenomegaly Vascular: No leg edema  Skin: No rash Lab Results:  Lab Results  Component Value Date   WBC 3.1 (L) 04/02/2022   HGB 12.1 (L) 04/02/2022   HCT 34.8 (L) 04/02/2022   MCV 96.4 04/02/2022   PLT 266 04/02/2022   NEUTROABS 2.1 04/02/2022    CMP  Lab Results  Component Value Date   NA 138 04/02/2022   K 4.3 04/02/2022   CL 104 04/02/2022   CO2 27 04/02/2022   GLUCOSE 128 (H) 04/02/2022   BUN 18 04/02/2022   CREATININE 0.87 04/02/2022   CALCIUM 8.8 (L) 04/02/2022   PROT 6.9 04/02/2022   ALBUMIN 4.2 04/02/2022   AST 16 04/02/2022   ALT 11 04/02/2022   ALKPHOS 48 04/02/2022    BILITOT 1.1 04/02/2022   GFRNONAA >60 04/02/2022   GFRAA 101 09/25/2019     Medications: I have reviewed the patient's current medications.   Assessment/Plan: Low-grade B-cell non-Hodgkin's lymphoma Palpable lymphadenopathy IgG kappa serum M spike with elevated free kappa light chain Bone marrow biopsy 12/05/2021-hypercellular marrow, 95%, diffusely infiltrated by B-cell lymphoma, no expression of CD5 or CD10, CD20 positive.  Differential diagnosis includes marginal zone lymphoma and lymphoplasmacytic lymphoma, flow cytometry identified a kappa restricted B-cell population compromising 80% of lymphocytes, 25 XY karyotype CTs 12/08/2021-lipoma to left lower neck/upper chest, hepatomegaly with numerous liver cysts, prominent right inguinal node, no splenomegaly, no suspicious bone lesions, 9 mm focus of hyperenhancement in the left hepatic lobe, small right adrenal nodule Cycle 1 Bendamustine/rituximab 12/18/2021 Cycle 2 Bendamustine/rituximab 01/15/2022 Cycle 3 Bendamustine/rituximab 02/12/2022, Bendamustine dose escalated to 90 mg per metered squared Cycle 4 Bendamustine/rituximab 03/19/2022-rituximab given over 2 days secondary to infusion rate related allergic reaction  2.   Macrocytic anemia secondary to #1 3.   Mild thrombocytosis 4.   History of prostate cancer 5.   Malaise secondary to #1 6.   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  The palpable lymph nodes are smaller.  The markers of lymphoma have improved.  He will undergo a restaging  bone marrow biopsy on 04/21/2022.  He will return for an office visit and repeat lymphoma markers on 04/29/2022.  He is now at day 15 following the  most recent cycle of chemotherapy.  He will call for a fever.   [name], MD  04/02/2022  3:32 PM

## 2022-04-20 ENCOUNTER — Other Ambulatory Visit: Payer: Self-pay | Admitting: Radiology

## 2022-04-20 DIAGNOSIS — C8580 Other specified types of non-Hodgkin lymphoma, unspecified site: Secondary | ICD-10-CM

## 2022-04-20 NOTE — H&P (Signed)
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 Pt currently receiving treatment for Powers-cell non-Hodgkin's lymphoma under the care of Ethan Coder, MD.Last bone marrow biopsy was 12/05/2021. Dr. [name] has referred pt for bone marrow biopsy for restaging.   Past Medical History:  Diagnosis Date   Basal cell cancer    Cancer ([place]) 03/2007   prostate Cancer s/p prostatectomy   Erectile dysfunction    Paroxysmal atrial fibrillation ([place])    s/p PVI at [facility] in 2014   Seasonal allergies     Past Surgical History:  Procedure Laterality Date   ATRIAL FIBRILLATION ABLATION  2014   [facility]   ATRIAL FIBRILLATION ABLATION N/A 10/03/2019   Procedure: [place];  Surgeon: Thompson Grayer, MD;  Location: [place] [facility];  Service: Cardiovascular;  Laterality: N/A;   CARDIOVERSION  01/15/2012   Procedure: CARDIOVERSION;  Surgeon: Sueanne Margarita, MD;  Location: [place];  Service: Cardiovascular;  Laterality: N/A;   JOINT REPLACEMENT  10/2006   left knee replacement   LAMINECTOMY  1982   L2/L3   PROSTATECTOMY      Allergies: Rituximab-pvvr  Medications: Prior to Admission medications   Medication Sig Start Date End Date Taking? Authorizing Provider  Multiple Vitamin (MULTIVITAMIN) capsule Take 1 capsule by mouth daily.    [provider]  Omega-3 Fatty Acids (FISH OIL PO) Take 1 tablet by mouth 3 (three) times a week.     [provider]  ondansetron (ZOFRAN) 8 MG tablet Take 1 tablet (8 mg total) by mouth every 8 (eight) hours as needed for nausea or vomiting. May start taking 72 hours  after IV chemotherapy day 1 at each cycle. Patient not taking: Reported on 12/17/2021 12/17/21   Ladell Pier, MD  prochlorperazine (COMPAZINE) 10 MG tablet Take 1 tablet (10 mg total) by mouth every 6 (six) hours as needed for nausea. Patient not taking: Reported on 12/17/2021 12/17/21   Ladell Pier, MD     Family History  Problem Relation Age of Onset   Cancer Paternal Grandfather        prostate CA    Social History   Socioeconomic History   Marital status: Married    Spouse name: Not on file   Number of children: Not on file   Years of education: Not on file   Highest education level: Not on file  Occupational History   Not on file  Tobacco Use   Smoking status: Never   Smokeless tobacco: Never  Vaping Use   Vaping Use: Never used  Substance and Sexual Activity   Alcohol use: Yes    Alcohol/week: 2.0 - 3.0 standard drinks of alcohol    Types: 2 - 3 Cans of beer per week   Drug use: Never   Sexual activity: Not on file  Other Topics Concern   Not on file  Social History Narrative   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  All other systems are negative.  Review of Systems  All other systems reviewed and are negative.   Vital Signs: BP 127/81   Pulse 65   Temp (!) 97.5 F (36.4 C) (Oral)   Resp 18   Ht 6' (1.829 m)   Wt 172 lb 9.9 oz (78.3 kg)   SpO2 100%   BMI 23.41 kg/m   Physical Exam Vitals reviewed.  Constitutional:      General: He is not in acute distress.    Appearance: Normal appearance. He is not ill-appearing.  HENT:     Head: Normocephalic and atraumatic.     Mouth/Throat:     Mouth: Mucous membranes are moist.      Pharynx: Oropharynx is clear.  Eyes:     Extraocular Movements: Extraocular movements intact.     Pupils: Pupils are equal, round, and reactive to light.  Cardiovascular:     Rate and Rhythm: Normal rate and regular rhythm.     Pulses: Normal pulses.     Heart sounds: Normal heart sounds.  Pulmonary:     Effort: Pulmonary effort is normal. No respiratory distress.     Breath sounds: Normal breath sounds.  Abdominal:     General: Abdomen is flat. There is no distension.     Palpations: Abdomen is soft.     Tenderness: There is no abdominal tenderness. There is no guarding.  Musculoskeletal:     Right lower leg: No edema.     Left lower leg: No edema.  Skin:    General: Skin is warm and dry.  Neurological:     Mental Status: He is alert and oriented to person, place, and time.  Psychiatric:        Mood and Affect: Mood normal.        Behavior: Behavior normal.        Thought Content: Thought content normal.        Judgment: Judgment normal.     Imaging: No results found.  Labs:  CBC: Recent Labs    03/03/22 0756 03/10/22 0831 03/18/22 0741 04/02/22 1410  WBC 2.2* 2.1* 2.8* 3.1*  HGB 12.3* 12.6* 12.6* 12.1*  HCT 37.0* 37.7* 37.1* 34.8*  PLT 280 237 193 266    COAGS: No results for input(s): "INR", "APTT" in the last 8760 hours.  BMP: Recent Labs    03/03/22 0756 03/10/22 0831 03/18/22 0741 04/02/22 1410  NA 140 139 140 138  K 4.9 5.0 4.3 4.3  CL 104 103 105 104  CO2 31 29 29 27   GLUCOSE 104* 100* 90 128*  BUN 17 15 16 18   CALCIUM 9.8 9.3 9.2 8.8*  CREATININE 0.85 0.82 0.85 0.87  GFRNONAA >60 >60 >60 >60    LIVER FUNCTION TESTS: Recent Labs    03/03/22 0756 03/10/22 0831 03/18/22 0741 04/02/22 1410  BILITOT 1.0 1.0 1.2 1.1  AST 19 16 20 16   ALT 18 14 18 11   ALKPHOS 52 56 45 48  PROT 7.2 7.1 6.9 6.9  ALBUMIN 4.2 4.2 4.1 4.2    TUMOR MARKERS: No results for input(s): "AFPTM", "CEA", "CA199", "CHROMGRNA" in the last 8760  hours.  Assessment and Plan: History of prostate cancer s/p prostatectomy, paroxysmal a-fib and Powers-cell non-Hodgkin's lymphoma. Pt currently receiving treatment for Powers-cell non-Hodgkin's lymphoma under the care of Ethan Coder, MD.Last bone marrow  biopsy was 12/05/2021. Dr. [name] has referred pt for bone marrow biopsy for restaging.   Pt resting on stretcher. He is A&O, calm and pleasant. He is in no distress.  Pt states he drank black coffee and water around 0715.  He denies the use of blood thinners.  Risks and benefits of bone marrow biopsy and aspiration with moderate sedation was discussed with the patient and/or patient's family including, but not limited to bleeding, infection, damage to adjacent structures or low yield requiring additional tests.  All of the questions were answered and there is agreement to proceed.  Consent signed and in chart.   Thank you for this interesting consult.  I greatly enjoyed meeting Ethan Peach, MD and look forward to participating in their care.  A copy of this report was sent to the requesting provider on this date.  Electronically Signed: Tyson Alias, NP 04/21/2022, 8:13 AM   I spent a total of 20 minutes in face to face in clinical consultation, greater than 50% of which was counseling/coordinating care for lymphoma restaging.

## 2022-04-21 ENCOUNTER — Other Ambulatory Visit: Payer: Self-pay

## 2022-04-21 ENCOUNTER — Encounter (HOSPITAL_COMMUNITY): Payer: Self-pay

## 2022-04-21 ENCOUNTER — Ambulatory Visit (HOSPITAL_COMMUNITY)
Admission: RE | Admit: 2022-04-21 | Discharge: 2022-04-21 | Disposition: A | Discharge status: Home / Self Care | Payer: BC Managed Care – PPO | Source: Ambulatory Visit | Attending: Oncology | Admitting: Oncology

## 2022-04-21 DIAGNOSIS — Z8546 Personal history of malignant neoplasm of prostate: Secondary | ICD-10-CM | POA: Diagnosis not present

## 2022-04-21 DIAGNOSIS — C8590 Non-Hodgkin lymphoma, unspecified, unspecified site: Secondary | ICD-10-CM | POA: Diagnosis not present

## 2022-04-21 DIAGNOSIS — C8580 Other specified types of non-Hodgkin lymphoma, unspecified site: Secondary | ICD-10-CM | POA: Diagnosis not present

## 2022-04-21 DIAGNOSIS — C8338 Diffuse large B-cell lymphoma, lymph nodes of multiple sites: Secondary | ICD-10-CM | POA: Diagnosis not present

## 2022-04-21 DIAGNOSIS — C8519 Unspecified B-cell lymphoma, extranodal and solid organ sites: Secondary | ICD-10-CM | POA: Diagnosis not present

## 2022-04-21 DIAGNOSIS — D539 Nutritional anemia, unspecified: Secondary | ICD-10-CM | POA: Diagnosis not present

## 2022-04-21 DIAGNOSIS — I480 Paroxysmal atrial fibrillation: Secondary | ICD-10-CM | POA: Diagnosis not present

## 2022-04-21 DIAGNOSIS — Z9079 Acquired absence of other genital organ(s): Secondary | ICD-10-CM | POA: Insufficient documentation

## 2022-04-21 DIAGNOSIS — D72819 Decreased white blood cell count, unspecified: Secondary | ICD-10-CM | POA: Diagnosis not present

## 2022-04-21 DIAGNOSIS — C8510 Unspecified B-cell lymphoma, unspecified site: Secondary | ICD-10-CM | POA: Diagnosis not present

## 2022-04-21 LAB — CBC WITH DIFFERENTIAL/PLATELET
Abs Immature Granulocytes: 0 10*3/uL (ref 0.00–0.07)
Basophils Absolute: 0 10*3/uL (ref 0.0–0.1)
Basophils Relative: 1 %
Eosinophils Absolute: 0.1 10*3/uL (ref 0.0–0.5)
Eosinophils Relative: 4 %
HCT: 37.2 % — ABNORMAL LOW (ref 39.0–52.0)
Hemoglobin: 12.7 g/dL — ABNORMAL LOW (ref 13.0–17.0)
Immature Granulocytes: 0 %
Lymphocytes Relative: 15 %
Lymphs Abs: 0.3 10*3/uL — ABNORMAL LOW (ref 0.7–4.0)
MCH: 34.3 pg — ABNORMAL HIGH (ref 26.0–34.0)
MCHC: 34.1 g/dL (ref 30.0–36.0)
MCV: 100.5 fL — ABNORMAL HIGH (ref 80.0–100.0)
Monocytes Absolute: 0.5 10*3/uL (ref 0.1–1.0)
Monocytes Relative: 23 %
Neutro Abs: 1.2 10*3/uL — ABNORMAL LOW (ref 1.7–7.7)
Neutrophils Relative %: 57 %
Platelets: 174 10*3/uL (ref 150–400)
RBC: 3.7 MIL/uL — ABNORMAL LOW (ref 4.22–5.81)
RDW: 14.1 % (ref 11.5–15.5)
WBC: 2.1 10*3/uL — ABNORMAL LOW (ref 4.0–10.5)
nRBC: 0 % (ref 0.0–0.2)

## 2022-04-21 MED ORDER — FENTANYL CITRATE (PF) 100 MCG/2ML IJ SOLN
INTRAMUSCULAR | Status: AC
  Filled 2022-04-21: qty 2

## 2022-04-21 MED ORDER — MIDAZOLAM HCL 2 MG/2ML IJ SOLN
INTRAMUSCULAR | Status: AC | PRN
  Administered 2022-04-21 (×3): 1 mg via INTRAVENOUS

## 2022-04-21 MED ORDER — FENTANYL CITRATE (PF) 100 MCG/2ML IJ SOLN
INTRAMUSCULAR | Status: AC | PRN
  Administered 2022-04-21 (×2): 50 ug via INTRAVENOUS

## 2022-04-21 MED ORDER — MIDAZOLAM HCL 2 MG/2ML IJ SOLN
INTRAMUSCULAR | Status: AC
  Filled 2022-04-21: qty 4

## 2022-04-21 MED ORDER — SODIUM CHLORIDE 0.9 % IV SOLN: INTRAVENOUS | Status: DC

## 2022-04-21 NOTE — Procedures (Signed)
Interventional Radiology Procedure Note  Procedure: CT guided bone marrow aspiration and biopsy  Complications: None  EBL: < 10 mL  Findings: Aspirate and core biopsy performed of bone marrow in right iliac bone.  Plan: Bedrest supine x 1 hrs  Aymee Fomby T. Brenton Joines, M.D Pager:  [phone]   

## 2022-04-22 LAB — SURGICAL PATHOLOGY

## 2022-04-23 DIAGNOSIS — C44329 Squamous cell carcinoma of skin of other parts of face: Secondary | ICD-10-CM | POA: Diagnosis not present

## 2022-04-27 ENCOUNTER — Encounter: Payer: Self-pay | Admitting: *Deleted

## 2022-04-27 ENCOUNTER — Encounter: Payer: Self-pay | Admitting: Oncology

## 2022-04-28 ENCOUNTER — Encounter: Payer: Self-pay | Admitting: *Deleted

## 2022-04-28 LAB — SURGICAL PATHOLOGY

## 2022-04-28 NOTE — Progress Notes (Signed)
Referral to Dr Gerald Stabs Dittus at [facility] faxed to [phone]

## 2022-04-29 ENCOUNTER — Other Ambulatory Visit: Payer: Self-pay

## 2022-04-29 ENCOUNTER — Inpatient Hospital Stay: Payer: BC Managed Care – PPO | Attending: Oncology

## 2022-04-29 ENCOUNTER — Inpatient Hospital Stay (HOSPITAL_BASED_OUTPATIENT_CLINIC_OR_DEPARTMENT_OTHER): Payer: BC Managed Care – PPO | Admitting: Oncology

## 2022-04-29 VITALS — BP 127/78 | HR 68 | Temp 98.2°F | Resp 18 | Ht 72.0 in | Wt 173.2 lb

## 2022-04-29 DIAGNOSIS — C8599 Non-Hodgkin lymphoma, unspecified, extranodal and solid organ sites: Secondary | ICD-10-CM | POA: Insufficient documentation

## 2022-04-29 DIAGNOSIS — R5381 Other malaise: Secondary | ICD-10-CM | POA: Insufficient documentation

## 2022-04-29 DIAGNOSIS — C8515 Unspecified B-cell lymphoma, lymph nodes of inguinal region and lower limb: Secondary | ICD-10-CM

## 2022-04-29 DIAGNOSIS — D709 Neutropenia, unspecified: Secondary | ICD-10-CM | POA: Insufficient documentation

## 2022-04-29 DIAGNOSIS — Z8546 Personal history of malignant neoplasm of prostate: Secondary | ICD-10-CM | POA: Diagnosis not present

## 2022-04-29 DIAGNOSIS — D539 Nutritional anemia, unspecified: Secondary | ICD-10-CM | POA: Diagnosis not present

## 2022-04-29 DIAGNOSIS — D75839 Thrombocytosis, unspecified: Secondary | ICD-10-CM | POA: Diagnosis not present

## 2022-04-29 LAB — CBC WITH DIFFERENTIAL (CANCER CENTER ONLY)
Abs Immature Granulocytes: 0 10*3/uL (ref 0.00–0.07)
Basophils Absolute: 0 10*3/uL (ref 0.0–0.1)
Basophils Relative: 1 %
Eosinophils Absolute: 0.1 10*3/uL (ref 0.0–0.5)
Eosinophils Relative: 6 %
HCT: 37.8 % — ABNORMAL LOW (ref 39.0–52.0)
Hemoglobin: 12.9 g/dL — ABNORMAL LOW (ref 13.0–17.0)
Immature Granulocytes: 0 %
Lymphocytes Relative: 15 %
Lymphs Abs: 0.3 10*3/uL — ABNORMAL LOW (ref 0.7–4.0)
MCH: 34.1 pg — ABNORMAL HIGH (ref 26.0–34.0)
MCHC: 34.1 g/dL (ref 30.0–36.0)
MCV: 100 fL (ref 80.0–100.0)
Monocytes Absolute: 0.4 10*3/uL (ref 0.1–1.0)
Monocytes Relative: 20 %
Neutro Abs: 1.2 10*3/uL — ABNORMAL LOW (ref 1.7–7.7)
Neutrophils Relative %: 58 %
Platelet Count: 207 10*3/uL (ref 150–400)
RBC: 3.78 MIL/uL — ABNORMAL LOW (ref 4.22–5.81)
RDW: 14 % (ref 11.5–15.5)
WBC Count: 2.1 10*3/uL — ABNORMAL LOW (ref 4.0–10.5)
nRBC: 0 % (ref 0.0–0.2)

## 2022-04-29 LAB — CMP (CANCER CENTER ONLY)
ALT: 19 U/L (ref 0–44)
AST: 31 U/L (ref 15–41)
Albumin: 4.4 g/dL (ref 3.5–5.0)
Alkaline Phosphatase: 51 U/L (ref 38–126)
Anion gap: 8 (ref 5–15)
BUN: 16 mg/dL (ref 8–23)
CO2: 32 mmol/L (ref 22–32)
Calcium: 9.9 mg/dL (ref 8.9–10.3)
Chloride: 103 mmol/L (ref 98–111)
Creatinine: 0.96 mg/dL (ref 0.61–1.24)
GFR, Estimated: >60 mL/min
Glucose, Bld: 97 mg/dL (ref 70–99)
Potassium: 4.5 mmol/L (ref 3.5–5.1)
Sodium: 143 mmol/L (ref 135–145)
Total Bilirubin: 1.7 mg/dL — ABNORMAL HIGH (ref 0.3–1.2)
Total Protein: 7.3 g/dL (ref 6.5–8.1)

## 2022-04-29 LAB — LACTATE DEHYDROGENASE: LDH: 166 U/L (ref 98–192)

## 2022-04-29 NOTE — Progress Notes (Signed)
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  He feels well.  No change in the palpable lymph nodes.  No new complaint.  Objective:  Vital signs in last 24 hours:  Blood pressure 127/78, pulse 68, temperature 98.2 F (36.8 C), temperature source Oral, resp. rate 18, height 6' (1.829 m), weight 173 lb 3.2 oz (78.6 kg), SpO2 100 %.    HEENT: No thrush or ulcers Lymphatics: No cervical, supraclavicular, or left axillary nodes.  1/2-1 cm right axillary nodes.  1-2 cm right inguinal node.  Less than 1 cm left inguinal nodes Resp: Lungs clear bilaterally Cardio: Regular rate and rhythm GI: No hepatosplenomegaly Vascular: No leg edema   Lab Results:  Lab Results  Component Value Date   WBC 2.1 (L) 04/29/2022   HGB 12.9 (L) 04/29/2022   HCT 37.8 (L) 04/29/2022   MCV 100.0 04/29/2022   PLT 207 04/29/2022   NEUTROABS 1.2 (L) 04/29/2022    CMP  Lab Results  Component Value Date   NA 143 04/29/2022   K 4.5 04/29/2022   CL 103 04/29/2022   CO2 32 04/29/2022   GLUCOSE 97 04/29/2022   BUN 16 04/29/2022   CREATININE 0.96 04/29/2022   CALCIUM 9.9 04/29/2022   PROT 7.3 04/29/2022   ALBUMIN 4.4 04/29/2022   AST 31 04/29/2022   ALT 19 04/29/2022   ALKPHOS 51 04/29/2022   BILITOT 1.7 (H) 04/29/2022   GFRNONAA >60 04/29/2022   GFRAA 101 09/25/2019   Medications: I have reviewed the patient's current medications.   Assessment/Plan:  Low-grade B-cell non-Hodgkin's lymphoma Palpable lymphadenopathy IgG kappa serum M spike with elevated free kappa light chain Bone marrow biopsy 12/05/2021-hypercellular marrow, 95%, diffusely infiltrated by B-cell lymphoma, no expression of CD5 or CD10, CD20 positive.  Differential diagnosis includes marginal zone lymphoma and lymphoplasmacytic lymphoma, flow cytometry identified a kappa restricted B-cell population compromising 80% of lymphocytes, 38  XY karyotype CTs 12/08/2021-lipoma to left lower neck/upper chest, hepatomegaly with numerous liver cysts, prominent right inguinal node, no splenomegaly, no suspicious bone lesions, 9 mm focus of hyperenhancement in the left hepatic lobe, small right adrenal nodule Cycle 1 Bendamustine/rituximab 12/18/2021 Cycle 2 Bendamustine/rituximab 01/15/2022 Cycle 3 Bendamustine/rituximab 02/12/2022, Bendamustine dose escalated to 90 mg per metered squared Cycle 4 Bendamustine/rituximab 03/19/2022-rituximab given over 2 days secondary to infusion rate related allergic reaction Bone marrow biopsy 04/21/2022-persistent involvement by low-grade B-cell lymphoma, improved, flow cytometry confirmed a monoclonal kappa restricted B-cell population, CD138 highlights a minor plasma cell component within the lymphoid infiltrates, the plasma cells have kappa light chain restriction  2.   Macrocytic anemia secondary to #1 3.   Mild thrombocytosis 4.   History of prostate cancer 5.   Malaise secondary to #1 6.   Pruritus/rash during rituximab infusion 12/18/2021-improved with additional Benadryl, Pepcid, completed rituximab infusion, recurrent allergy symptoms during subsequent rituximab infusions-infusions completed at a slower rate with additional steroids and antihistamine therapy     Disposition: Dr. [name] appears stable.  The restaging bone marrow reveals persistent involvement with low-grade lymphoma, though the degree of bone marrow involvement has improved.  He has completed 4 cycles of Bendamustine/rituximab.  He has persistent mild neutropenia.  Dr. [name] has a low-grade B-cell lymphoma, otherwise not categorized.  We discussed treatment options.  I recommended referral to the lymphoma service at [facility] for review of the bone marrow pathology and treatment recommendations.  I discussed potential treatment options with  Dr. [name] including treatment with a myeloma based regimen and immunotherapy.  The plan is observation  for now. I will follow-up on the serum protein electrophoresis and IgG level from today. [name], MD  04/29/2022  3:24 PM

## 2022-04-30 ENCOUNTER — Encounter (HOSPITAL_COMMUNITY): Payer: Self-pay | Admitting: Oncology

## 2022-04-30 LAB — KAPPA/LAMBDA LIGHT CHAINS
Kappa free light chain: 40.2 mg/L — ABNORMAL HIGH (ref 3.3–19.4)
Kappa, lambda light chain ratio: 11.49 — ABNORMAL HIGH (ref 0.26–1.65)
Lambda free light chains: 3.5 mg/L — ABNORMAL LOW (ref 5.7–26.3)

## 2022-04-30 LAB — IGG: IgG (Immunoglobin G), Serum: 1445 mg/dL (ref 603–1613)

## 2022-05-01 LAB — PROTEIN ELECTROPHORESIS, SERUM
A/G Ratio: 1.5 (ref 0.7–1.7)
Albumin ELP: 4.1 g/dL (ref 2.9–4.4)
Alpha-1-Globulin: 0.2 g/dL (ref 0.0–0.4)
Alpha-2-Globulin: 0.4 g/dL (ref 0.4–1.0)
Beta Globulin: 0.8 g/dL (ref 0.7–1.3)
Gamma Globulin: 1.3 g/dL (ref 0.4–1.8)
Globulin, Total: 2.7 g/dL (ref 2.2–3.9)
M-Spike, %: 1.2 g/dL — ABNORMAL HIGH
Total Protein ELP: 6.8 g/dL (ref 6.0–8.5)

## 2022-05-18 DIAGNOSIS — C44329 Squamous cell carcinoma of skin of other parts of face: Secondary | ICD-10-CM | POA: Diagnosis not present

## 2022-05-18 DIAGNOSIS — L988 Other specified disorders of the skin and subcutaneous tissue: Secondary | ICD-10-CM | POA: Diagnosis not present

## 2022-05-20 DIAGNOSIS — C8510 Unspecified B-cell lymphoma, unspecified site: Secondary | ICD-10-CM | POA: Diagnosis not present

## 2022-05-20 DIAGNOSIS — Z888 Allergy status to other drugs, medicaments and biological substances status: Secondary | ICD-10-CM | POA: Diagnosis not present

## 2022-05-20 DIAGNOSIS — C8590 Non-Hodgkin lymphoma, unspecified, unspecified site: Secondary | ICD-10-CM | POA: Diagnosis not present

## 2022-05-20 DIAGNOSIS — C8580 Other specified types of non-Hodgkin lymphoma, unspecified site: Secondary | ICD-10-CM | POA: Diagnosis not present

## 2022-05-21 ENCOUNTER — Inpatient Hospital Stay: Payer: BC Managed Care – PPO | Attending: Oncology | Admitting: Oncology

## 2022-05-21 ENCOUNTER — Encounter: Payer: Self-pay | Admitting: Oncology

## 2022-05-21 VITALS — BP 136/82 | HR 82 | Temp 98.1°F | Resp 18 | Ht 72.0 in | Wt 171.8 lb

## 2022-05-21 DIAGNOSIS — R5381 Other malaise: Secondary | ICD-10-CM | POA: Insufficient documentation

## 2022-05-21 DIAGNOSIS — C8510 Unspecified B-cell lymphoma, unspecified site: Secondary | ICD-10-CM | POA: Diagnosis not present

## 2022-05-21 DIAGNOSIS — D75839 Thrombocytosis, unspecified: Secondary | ICD-10-CM | POA: Insufficient documentation

## 2022-05-21 DIAGNOSIS — Z8546 Personal history of malignant neoplasm of prostate: Secondary | ICD-10-CM | POA: Diagnosis not present

## 2022-05-21 DIAGNOSIS — D539 Nutritional anemia, unspecified: Secondary | ICD-10-CM | POA: Diagnosis not present

## 2022-05-21 DIAGNOSIS — C8515 Unspecified B-cell lymphoma, lymph nodes of inguinal region and lower limb: Secondary | ICD-10-CM | POA: Diagnosis not present

## 2022-05-21 NOTE — Progress Notes (Signed)
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  He feels well.  Good appetite and energy level.  Stable palpable lymph nodes.  No new complaint. He saw Dr. Aviva Kluver yesterday.  He recommends observation with a restaging PET in 2 months.  Objective:  Vital signs in last 24 hours:  Blood pressure 136/82, pulse 82, temperature 98.1 F (36.7 C), temperature source Oral, resp. rate 18, height 6' (1.829 m), weight 171 lb 12.8 oz (77.9 kg), SpO2 100 %.    Lymphatics: No cervical, supraclavicular, or left axillary nodes.  0.5-1 cm right axillary nodes, 1-2 cm right inguinal node. Resp: Lungs clear bilaterally Cardio: Regular rate and rhythm GI: No hepatosplenomegaly, no mass, nontender Vascular: No leg edema   Lab Results:  Lab Results  Component Value Date   WBC 2.1 (L) 04/29/2022   HGB 12.9 (L) 04/29/2022   HCT 37.8 (L) 04/29/2022   MCV 100.0 04/29/2022   PLT 207 04/29/2022   NEUTROABS 1.2 (L) 04/29/2022    CMP  Lab Results  Component Value Date   NA 143 04/29/2022   K 4.5 04/29/2022   CL 103 04/29/2022   CO2 32 04/29/2022   GLUCOSE 97 04/29/2022   BUN 16 04/29/2022   CREATININE 0.96 04/29/2022   CALCIUM 9.9 04/29/2022   PROT 7.3 04/29/2022   ALBUMIN 4.4 04/29/2022   AST 31 04/29/2022   ALT 19 04/29/2022   ALKPHOS 51 04/29/2022   BILITOT 1.7 (H) 04/29/2022   GFRNONAA >60 04/29/2022   GFRAA 101 09/25/2019    No results found for: "CEA1", "CEA", "BXI356", "CA125"  No results found for: "INR", "LABPROT"  Imaging:  No results found.  Medications: I have reviewed the patient's current medications.   Assessment/Plan: Low-grade B-cell non-Hodgkin's lymphoma Palpable lymphadenopathy IgG kappa serum M spike with elevated free kappa light chain Bone marrow biopsy 12/05/2021-hypercellular marrow, 95%, diffusely infiltrated by B-cell lymphoma, no expression of CD5 or CD10,  CD20 positive.  Differential diagnosis includes marginal zone lymphoma and lymphoplasmacytic lymphoma, flow cytometry identified a kappa restricted B-cell population compromising 80% of lymphocytes, 46 XY karyotype,MYD88 positive CTs 12/08/2021-lipoma to left lower neck/upper chest, hepatomegaly with numerous liver cysts, prominent right inguinal node, no splenomegaly, no suspicious bone lesions, 9 mm focus of hyperenhancement in the left hepatic lobe, small right adrenal nodule Cycle 1 Bendamustine/rituximab 12/18/2021 Cycle 2 Bendamustine/rituximab 01/15/2022 Cycle 3 Bendamustine/rituximab 02/12/2022, Bendamustine dose escalated to 90 mg per metered squared Cycle 4 Bendamustine/rituximab 03/19/2022-rituximab given over 2 days secondary to infusion rate related allergic reaction Bone marrow biopsy 04/21/2022-persistent involvement by low-grade B-cell lymphoma, improved, flow cytometry confirmed a monoclonal kappa restricted B-cell population, CD138 highlights a minor plasma cell component within the lymphoid infiltrates, the plasma cells have kappa light chain restriction  2.   Macrocytic anemia secondary to #1 3.   Mild thrombocytosis 4.   History of prostate cancer 5.   Malaise secondary to #1 6.   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  He completed 4 cycles of Bendamustine/Rituxan with partial improvement in the serum M protein and bone marrow involvement with lymphoma.  Malaise improved following treatment.  He had recurrent infusion related reactions to rituximab.  He appears asymptomatic from the lymphoma at present.  He appears to have a low-grade non-Hodgkin's lymphoma, likely lymphoplasmacytic lymphoma.  He was seen  at [facility] yesterday.  The plan is observation with a restaging PET and lab follow-up in 2 months.  He will return for an office visit here on 07/28/2022.  [name], MD  05/21/2022  8:10 AM

## 2022-05-28 ENCOUNTER — Ambulatory Visit (HOSPITAL_BASED_OUTPATIENT_CLINIC_OR_DEPARTMENT_OTHER): Payer: BC Managed Care – PPO | Admitting: Internal Medicine

## 2022-05-29 ENCOUNTER — Encounter (HOSPITAL_BASED_OUTPATIENT_CLINIC_OR_DEPARTMENT_OTHER): Payer: Self-pay | Admitting: Family

## 2022-05-29 ENCOUNTER — Ambulatory Visit (INDEPENDENT_AMBULATORY_CARE_PROVIDER_SITE_OTHER): Payer: BC Managed Care – PPO | Admitting: Family

## 2022-05-29 VITALS — BP 116/64 | HR 62 | Ht 72.0 in | Wt 173.2 lb

## 2022-05-29 DIAGNOSIS — I480 Paroxysmal atrial fibrillation: Secondary | ICD-10-CM | POA: Diagnosis not present

## 2022-05-29 NOTE — Patient Instructions (Signed)
Medication Instructions:  Continue your current medications.   *If you need a refill on your cardiac medications before your next appointment, please call your pharmacy*   Lab Work: None ordered today.   Testing/Procedures: Your EKG today shows normal sinus rhythm which is a good result.    Follow-Up: At [facility], you and your health needs are our priority.  As part of our continuing mission to provide you with exceptional heart care, we have created designated Provider Care Teams.  These Care Teams include your primary Cardiologist (physician) and Advanced Practice Providers (APPs -  Physician Assistants and Nurse Practitioners) who all work together to provide you with the care you need, when you need it.  We recommend signing up for the patient portal called "MyChart".  Sign up information is provided on this After Visit Summary.  MyChart is used to connect with patients for Virtual Visits (Telemedicine).  Patients are able to view lab/test results, encounter notes, upcoming appointments, etc.  Non-urgent messages can be sent to your provider as well.   To learn more about what you can do with MyChart, go to [email or web address].    Your next appointment:   2 year(s)  The format for your next appointment:   In Person  Provider:   Thompson Grayer, MD    Other Instructions  To prevent palpitations: Make sure you are adequately hydrated.  Avoid and/or limit caffeine containing beverages like soda or tea. Exercise regularly.  Manage stress well. Some over the counter medications can cause palpitations such as Benadryl, AdvilPM, TylenolPM. Regular Advil or Tylenol do not cause palpitations.   Heart Healthy Diet Recommendations: A low-salt diet is recommended. Meats should be grilled, baked, or boiled. Avoid fried foods. Focus on lean protein sources like fish or chicken with vegetables and fruits. The American Heart Association is a Microbiologist!  American Heart  Association Diet and Lifeystyle Recommendations   Exercise recommendations: The American Heart Association recommends 150 minutes of moderate intensity exercise weekly. Try 30 minutes of moderate intensity exercise 4-5 times per week. This could include walking, jogging, or swimming.    Important Information About Sugar

## 2022-05-29 NOTE — Progress Notes (Signed)
Office Visit    Patient Name: Ethan Peach, Ethan Powers Date of Encounter: 05/29/2022  PCP:  Gaynelle Arabian, [place]  Cardiologist:  None  Advanced Practice Provider:  No care team member to display Electrophysiologist:  Thompson Grayer, Ethan Powers      Chief Complaint    Ethan Peach, Ethan Powers is a 61 y.o. male presents today for atrial fibrillation follow-up  Past Medical History    Past Medical History:  Diagnosis Date   Basal cell cancer    Cancer [facility]) 03/2007   prostate Cancer s/p prostatectomy   Erectile dysfunction    Paroxysmal atrial fibrillation ([place])    s/p PVI at [facility] in 2014   Seasonal allergies    Past Surgical History:  Procedure Laterality Date   ATRIAL FIBRILLATION ABLATION  2014   [facility]   ATRIAL FIBRILLATION ABLATION N/A 10/03/2019   Procedure: [place];  Surgeon: Thompson Grayer, Ethan Powers;  Location: [facility];  Service: Cardiovascular;  Laterality: N/A;   CARDIOVERSION  01/15/2012   Procedure: CARDIOVERSION;  Surgeon: Sueanne Margarita, Ethan Powers;  Location: [place];  Service: Cardiovascular;  Laterality: N/A;   JOINT REPLACEMENT  10/2006   left knee replacement   LAMINECTOMY  1982   L2/L3   PROSTATECTOMY      Allergies  Allergies  Allergen Reactions   Rituximab-Pvvr Hives, Itching and Rash    Pt had redness, hives and itching on scalp and hairline and abdomen and back. 01/15/22--Pt had similar reaction to rituximab with chest tightness in addition to above symptoms. See Infusion note from 01/15/22  02/12/2022: Itching in ears and head with nasal drainage.  Patient had hypersensitivity reaction to Rituxan. See progress note from 02/12/2022 at 1618. Patient is able to complete infusion.  03/19/2022 pt had hypersensitivity reaction. See progress note.Unable to complete full dose on this date    History of Present Illness    Ethan Peach, Ethan Powers is a 61 y.o. male with a hx of paroxysmal atrial fibrillation s/p PVI at [facility] in  2014 and by Dr. Rayann Heman November 2020.  Last seen via virtual visit 04/15/2020.  He had prior PVI for atrial fibrillation at [facility] in 2014.  Repeat ablation November 2020 by Dr. Rayann Heman.  He has maintained sinus rhythm since that time.  AAD therapy has been discontinued.  He was last seen 6-7/21 via virtual visit by Dr. Rayann Heman doing well.  Due to CHA2DS2-VASc of 0 does not require [place] therapy.  Follows routinely with Dr. [name] in [facility] hematology due to low-grade B-cell non-Hodgkin's lymphoma.  Completed cycle 4 of bendamustine/rituximab 03/19/22 with bone marrow biopsy 04/21/2022 showing partial improvement in serum M protein and bone marrow involvement with lymphoma.  Malaise improved following treatment.  Plan to proceed with restaging PET and follow-up in 2 months per last visit 05/21/2022 with hematology.   He presents independently for cardiology follow-up.  Reports feeling well.  He will have palpitations once per week the last only seconds and self resolved.  They feel different than prior atrial fibrillation.  We discussed prior monitor that showed PACs which is likely etiology.  Overall not bothersome and infrequent. Reports no shortness of breath nor dyspnea on exertion. Reports no chest pain, pressure, or tightness. No edema, orthopnea, PND.  Planning to spend the weekend watching his 62-monthold granddaughter.  EKGs/Labs/Other Studies Reviewed:   The following studies were reviewed today:   EKG:  EKG is ordered today.  The ekg ordered today demonstrates  NSR 62 bpm with no acute ST/T wave changes.   Recent Labs: 11/24/2021: TSH 4.149 04/29/2022: ALT 19; BUN 16; Creatinine 0.96; Hemoglobin 12.9; Platelet Count 207; Potassium 4.5; Sodium 143  Recent Lipid Panel No results found for: "CHOL", "TRIG", "HDL", "CHOLHDL", "VLDL", "LDLCALC", "LDLDIRECT"    Home Medications   No outpatient medications have been marked as taking for the 05/29/22 encounter (Office Visit) with Loel Dubonnet, NP.      Review of Systems      All other systems reviewed and are otherwise negative except as noted above.  Physical Exam    VS:  BP 116/64 (BP Location: Left Arm, Patient Position: Sitting, Cuff Size: Large)   Pulse 62   Ht 6' (1.829 m)   Wt 173 lb 3.2 oz (78.6 kg)   BMI 23.49 kg/m  , BMI Body mass index is 23.49 kg/m.  Wt Readings from Last 3 Encounters:  05/29/22 173 lb 3.2 oz (78.6 kg)  05/21/22 171 lb 12.8 oz (77.9 kg)  04/29/22 173 lb 3.2 oz (78.6 kg)     GEN: Well nourished, well developed, in no acute distress. HEENT: normal. Neck: Supple, no JVD, carotid bruits, or masses. Cardiac: RRR, no murmurs, rubs, or gallops. No clubbing, cyanosis, edema.  Radials/PT 2+ and equal bilaterally.  Respiratory:  Respirations regular and unlabored, clear to auscultation bilaterally. GI: Soft, nontender, nondistended. MS: No deformity or atrophy. Skin: Warm and dry, no rash. Neuro:  Strength and sensation are intact. Psych: Normal affect.  Assessment & Plan    PAF - s/p PVI ablation 2014 at [facility] in November 2020 by Dr. Rayann Heman. No recurrence off AAD.  CHA2DS2-VASc of 0, no indication for [place].  Reports fleeting palpitations that last only seconds once per week.  They feel different than his previous atrial fibrillation and likely reflect PACs.  They are over not bothersome.  He will contact office if they become more bothersome and we will consider ZIO monitor at that time.  Low-grade B-cell non-Hodgkin lymphoma -following with oncology    Disposition: Follow up in 2 year(s) with Dr. Rayann Heman or APP.  Signed, Loel Dubonnet, NP 05/29/2022, 5:39 PM [place]

## 2022-06-01 ENCOUNTER — Other Ambulatory Visit: Payer: Self-pay

## 2022-06-01 ENCOUNTER — Encounter: Payer: Self-pay | Admitting: Oncology

## 2022-06-09 ENCOUNTER — Other Ambulatory Visit: Payer: Self-pay

## 2022-06-24 ENCOUNTER — Encounter: Payer: Self-pay | Admitting: Oncology

## 2022-07-01 DIAGNOSIS — L570 Actinic keratosis: Secondary | ICD-10-CM | POA: Diagnosis not present

## 2022-07-01 DIAGNOSIS — C4441 Basal cell carcinoma of skin of scalp and neck: Secondary | ICD-10-CM | POA: Diagnosis not present

## 2022-07-08 ENCOUNTER — Other Ambulatory Visit: Payer: Self-pay | Admitting: Oncology

## 2022-07-17 DIAGNOSIS — C880 Waldenstrom macroglobulinemia: Secondary | ICD-10-CM | POA: Diagnosis not present

## 2022-07-28 ENCOUNTER — Inpatient Hospital Stay: Payer: BC Managed Care – PPO | Admitting: Oncology

## 2022-08-28 DIAGNOSIS — N2889 Other specified disorders of kidney and ureter: Secondary | ICD-10-CM | POA: Diagnosis not present

## 2022-08-28 DIAGNOSIS — C8590 Non-Hodgkin lymphoma, unspecified, unspecified site: Secondary | ICD-10-CM | POA: Diagnosis not present

## 2022-08-28 DIAGNOSIS — C880 Waldenstrom macroglobulinemia: Secondary | ICD-10-CM | POA: Diagnosis not present

## 2022-08-28 DIAGNOSIS — C8580 Other specified types of non-Hodgkin lymphoma, unspecified site: Secondary | ICD-10-CM | POA: Diagnosis not present

## 2022-08-28 DIAGNOSIS — E278 Other specified disorders of adrenal gland: Secondary | ICD-10-CM | POA: Diagnosis not present

## 2022-09-02 ENCOUNTER — Inpatient Hospital Stay: Payer: BC Managed Care – PPO | Admitting: Oncology

## 2022-09-22 ENCOUNTER — Other Ambulatory Visit: Payer: Self-pay | Admitting: *Deleted

## 2022-09-22 ENCOUNTER — Encounter: Payer: Self-pay | Admitting: *Deleted

## 2022-09-22 ENCOUNTER — Ambulatory Visit
Admission: RE | Admit: 2022-09-22 | Discharge: 2022-09-22 | Disposition: A | Discharge status: Home / Self Care | Payer: Self-pay | Source: Ambulatory Visit | Attending: Oncology | Admitting: Oncology

## 2022-09-22 ENCOUNTER — Inpatient Hospital Stay: Payer: BC Managed Care – PPO

## 2022-09-22 ENCOUNTER — Inpatient Hospital Stay: Payer: BC Managed Care – PPO | Attending: Oncology | Admitting: Oncology

## 2022-09-22 VITALS — BP 142/85 | HR 72 | Temp 98.1°F | Resp 18 | Ht 72.0 in | Wt 171.8 lb

## 2022-09-22 DIAGNOSIS — Z23 Encounter for immunization: Secondary | ICD-10-CM

## 2022-09-22 DIAGNOSIS — C8580 Other specified types of non-Hodgkin lymphoma, unspecified site: Secondary | ICD-10-CM

## 2022-09-22 MED ORDER — INFLUENZA VAC SPLIT QUAD 0.5 ML IM SUSY
0.5000 mL | PREFILLED_SYRINGE | Freq: Once | INTRAMUSCULAR | Status: AC
  Administered 2022-09-22: 0.5 mL via INTRAMUSCULAR
  Filled 2022-09-22: qty 0.5

## 2022-09-22 NOTE — Progress Notes (Signed)
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  He generally feels well.  He is exercising and playing golf.  Good appetite.  He believes the right inguinal lymph node has enlarged slightly since completing chemotherapy. He has been evaluated at [facility] over the past few months.  He underwent a restaging evaluation 08/28/2022.  They feel he has experienced a partial response to the Bendamustine/rituximab with a decrease in the serum monoclonal protein and symptoms.  Observation is recommended.  He is scheduled for a restaging PET scan and office visit at [facility] in 6 months.  Dr. [name] reports numbness in the hands for a few seconds when waking up in the morning.  No associated symptoms.  No neck pain.  Objective:  Vital signs in last 24 hours:  Blood pressure (!) 142/85, pulse 72, temperature 98.1 F (36.7 C), temperature source Oral, resp. rate 18, height 6' (1.829 m), weight 171 lb 12.8 oz (77.9 kg), SpO2 100 %.    Lymphatics: No cervical or supraclavicular nodes.  1-1.5 cm bilateral axillary nodes.  1.5-2 cm right inguinal node. Resp: Lungs clear bilaterally Cardio: Good rate and rhythm GI: No hepatosplenomegaly Vascular: No leg edema Neuro: The motor exam appears intact in the arms and hands bilaterally  Lab Results:  Lab Results  Component Value Date   WBC 2.1 (L) 04/29/2022   HGB 12.9 (L) 04/29/2022   HCT 37.8 (L) 04/29/2022   MCV 100.0 04/29/2022   PLT 207 04/29/2022   NEUTROABS 1.2 (L) 04/29/2022   CBC at [facility] 08/28/2022: Hemoglobin 14.8, platelets 263,000, WBC 2.7, ANC 1.6  Serum M spike 1.1, IgG 1538, free kappa light chains 4.21  Medications: I have reviewed the patient's current medications.   Assessment/Plan: Low-grade B-cell non-Hodgkin's lymphoma Palpable lymphadenopathy IgG kappa serum M spike with elevated free kappa light chain Bone marrow biopsy  12/05/2021-hypercellular marrow, 95%, diffusely infiltrated by B-cell lymphoma, no expression of CD5 or CD10, CD20 positive.  Differential diagnosis includes marginal zone lymphoma and lymphoplasmacytic lymphoma, flow cytometry identified a kappa restricted B-cell population compromising 80% of lymphocytes, 41 XY karyotype,MYD88 positive CTs 12/08/2021-lipoma to left lower neck/upper chest, hepatomegaly with numerous liver cysts, prominent right inguinal node, no splenomegaly, no suspicious bone lesions, 9 mm focus of hyperenhancement in the left hepatic lobe, small right adrenal nodule Cycle 1 Bendamustine/rituximab 12/18/2021 Cycle 2 Bendamustine/rituximab 01/15/2022 Cycle 3 Bendamustine/rituximab 02/12/2022, Bendamustine dose escalated to 90 mg per metered squared Cycle 4 Bendamustine/rituximab 03/19/2022-rituximab given over 2 days secondary to infusion rate related allergic reaction Bone marrow biopsy 04/21/2022-persistent involvement by low-grade B-cell lymphoma, improved, flow cytometry confirmed a monoclonal kappa restricted B-cell population, CD138 highlights a minor plasma cell component within the lymphoid infiltrates, the plasma cells have kappa light chain restriction PET at [facility] 08/28/2022-right hilar and bilateral axillary nodes with minimal uptake, no suspicious hypermetabolic lymph nodes in the abdomen or pelvis, mild diffuse marrow uptake comparison to the PET from 12/22/2021-bilateral axillary, subpectoral, right hilar, and abdominal lymph nodes have decreased uptake consistent with treatment response, Deauville 2, creased bone marrow uptake  2.   Macrocytic anemia secondary to #1 3.   Mild thrombocytosis 4.   History of prostate cancer 5.   Malaise secondary to #1 6.   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  He has experienced a partial clinical response.  The plan is to continue observation.  He will return for an office and restaging labs in 3 months.  He will be scheduled for a follow-up PET at [facility] at a 66-monthinterval.  The hand numbness is likely related to a benign musculoskeletal condition.  He will call for increased symptoms.  He will receive an influenza vaccine today.  GBetsy Coder MD  09/22/2022  11:37 AM

## 2022-09-22 NOTE — Progress Notes (Signed)
Request faxed to [facility] radiology for PET scan form 08/2022 to be uploaded into [facility]

## 2022-10-07 DIAGNOSIS — D3132 Benign neoplasm of left choroid: Secondary | ICD-10-CM | POA: Diagnosis not present

## 2022-10-12 DIAGNOSIS — M25511 Pain in right shoulder: Secondary | ICD-10-CM | POA: Diagnosis not present

## 2022-10-30 DIAGNOSIS — M25511 Pain in right shoulder: Secondary | ICD-10-CM | POA: Diagnosis not present

## 2022-11-06 DIAGNOSIS — M25511 Pain in right shoulder: Secondary | ICD-10-CM | POA: Diagnosis not present

## 2022-11-30 ENCOUNTER — Inpatient Hospital Stay: Payer: BC Managed Care – PPO | Attending: Oncology

## 2022-11-30 DIAGNOSIS — D539 Nutritional anemia, unspecified: Secondary | ICD-10-CM | POA: Insufficient documentation

## 2022-11-30 DIAGNOSIS — C8510 Unspecified B-cell lymphoma, unspecified site: Secondary | ICD-10-CM | POA: Diagnosis not present

## 2022-11-30 DIAGNOSIS — R5381 Other malaise: Secondary | ICD-10-CM | POA: Insufficient documentation

## 2022-11-30 DIAGNOSIS — C8580 Other specified types of non-Hodgkin lymphoma, unspecified site: Secondary | ICD-10-CM

## 2022-11-30 DIAGNOSIS — D75839 Thrombocytosis, unspecified: Secondary | ICD-10-CM | POA: Diagnosis not present

## 2022-11-30 DIAGNOSIS — Z8546 Personal history of malignant neoplasm of prostate: Secondary | ICD-10-CM | POA: Diagnosis not present

## 2022-11-30 LAB — CMP (CANCER CENTER ONLY)
ALT: 11 U/L (ref 0–44)
AST: 16 U/L (ref 15–41)
Albumin: 4.4 g/dL (ref 3.5–5.0)
Alkaline Phosphatase: 52 U/L (ref 38–126)
Anion gap: 8 (ref 5–15)
BUN: 14 mg/dL (ref 8–23)
CO2: 29 mmol/L (ref 22–32)
Calcium: 9.4 mg/dL (ref 8.9–10.3)
Chloride: 103 mmol/L (ref 98–111)
Creatinine: 0.9 mg/dL (ref 0.61–1.24)
GFR, Estimated: >60 mL/min (ref >60)
Glucose, Bld: 89 mg/dL (ref 70–99)
Potassium: 4.4 mmol/L (ref 3.5–5.1)
Sodium: 140 mmol/L (ref 135–145)
Total Bilirubin: 1.1 mg/dL (ref 0.3–1.2)
Total Protein: 7.2 g/dL (ref 6.5–8.1)

## 2022-11-30 LAB — CBC WITH DIFFERENTIAL (CANCER CENTER ONLY)
Abs Immature Granulocytes: 0.01 10*3/uL (ref 0.00–0.07)
Basophils Absolute: 0 10*3/uL (ref 0.0–0.1)
Basophils Relative: 1 %
Eosinophils Absolute: 0.1 10*3/uL (ref 0.0–0.5)
Eosinophils Relative: 3 %
HCT: 40.9 % (ref 39.0–52.0)
Hemoglobin: 13.9 g/dL (ref 13.0–17.0)
Immature Granulocytes: 0 %
Lymphocytes Relative: 18 %
Lymphs Abs: 0.6 10*3/uL — ABNORMAL LOW (ref 0.7–4.0)
MCH: 33.5 pg (ref 26.0–34.0)
MCHC: 34 g/dL (ref 30.0–36.0)
MCV: 98.6 fL (ref 80.0–100.0)
Monocytes Absolute: 0.5 10*3/uL (ref 0.1–1.0)
Monocytes Relative: 16 %
Neutro Abs: 2 10*3/uL (ref 1.7–7.7)
Neutrophils Relative %: 62 %
Platelet Count: 292 10*3/uL (ref 150–400)
RBC: 4.15 MIL/uL — ABNORMAL LOW (ref 4.22–5.81)
RDW: 12.8 % (ref 11.5–15.5)
WBC Count: 3.2 10*3/uL — ABNORMAL LOW (ref 4.0–10.5)
nRBC: 0 % (ref 0.0–0.2)

## 2022-11-30 LAB — LACTATE DEHYDROGENASE: LDH: 131 U/L (ref 98–192)

## 2022-12-01 LAB — KAPPA/LAMBDA LIGHT CHAINS
Kappa free light chain: 51.4 mg/L — ABNORMAL HIGH (ref 3.3–19.4)
Kappa, lambda light chain ratio: 12.54 — ABNORMAL HIGH (ref 0.26–1.65)
Lambda free light chains: 4.1 mg/L — ABNORMAL LOW (ref 5.7–26.3)

## 2022-12-03 ENCOUNTER — Inpatient Hospital Stay: Payer: BC Managed Care – PPO | Admitting: Oncology

## 2022-12-03 VITALS — BP 132/84 | HR 66 | Temp 98.2°F | Resp 18 | Ht 72.0 in | Wt 173.8 lb

## 2022-12-03 DIAGNOSIS — C8515 Unspecified B-cell lymphoma, lymph nodes of inguinal region and lower limb: Secondary | ICD-10-CM | POA: Diagnosis not present

## 2022-12-03 DIAGNOSIS — D539 Nutritional anemia, unspecified: Secondary | ICD-10-CM | POA: Diagnosis not present

## 2022-12-03 DIAGNOSIS — R5381 Other malaise: Secondary | ICD-10-CM | POA: Diagnosis not present

## 2022-12-03 DIAGNOSIS — Z8546 Personal history of malignant neoplasm of prostate: Secondary | ICD-10-CM | POA: Diagnosis not present

## 2022-12-03 DIAGNOSIS — C8510 Unspecified B-cell lymphoma, unspecified site: Secondary | ICD-10-CM | POA: Diagnosis not present

## 2022-12-03 DIAGNOSIS — D75839 Thrombocytosis, unspecified: Secondary | ICD-10-CM | POA: Diagnosis not present

## 2022-12-03 LAB — MULTIPLE MYELOMA PANEL, SERUM
Albumin SerPl Elph-Mcnc: 4 g/dL (ref 2.9–4.4)
Albumin/Glob SerPl: 1.5 (ref 0.7–1.7)
Alpha 1: 0.2 g/dL (ref 0.0–0.4)
Alpha2 Glob SerPl Elph-Mcnc: 0.5 g/dL (ref 0.4–1.0)
B-Globulin SerPl Elph-Mcnc: 0.7 g/dL (ref 0.7–1.3)
Gamma Glob SerPl Elph-Mcnc: 1.2 g/dL (ref 0.4–1.8)
Globulin, Total: 2.7 g/dL (ref 2.2–3.9)
IgA: 20 mg/dL — ABNORMAL LOW (ref 61–437)
IgG (Immunoglobin G), Serum: 1410 mg/dL (ref 603–1613)
IgM (Immunoglobulin M), Srm: 31 mg/dL (ref 20–172)
M Protein SerPl Elph-Mcnc: 0.8 g/dL — ABNORMAL HIGH
Total Protein ELP: 6.7 g/dL (ref 6.0–8.5)

## 2022-12-03 NOTE — Progress Notes (Signed)
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  He feels well.  No fever or night sweats.  There are persistent small palpable lymph nodes in the bilateral groin.  He has discomfort in the right shoulder.  He is followed by orthopedics for a supraspinatus tear.  An MRI via the orthopedics office revealed abnormal bone marrow signal (we do not have the report available today).  Dr. [name] does not wish to undergo a restaging PET scan due to the radiation exposure.  Objective:  Vital signs in last 24 hours:  Blood pressure 132/84, pulse 66, temperature 98.2 F (36.8 C), resp. rate 18, height 6' (1.829 m), weight 173 lb 12.8 oz (78.8 kg), SpO2 100 %.    Lymphatics: No cervical or supraclavicular nodes.  1 cm mobile bilateral axillary nodes.  1/2-1 cm bilateral inguinal and left femoral nodes Resp: Lungs clear bilaterally Cardio: Regular rate and rhythm GI: No hepatosplenomegaly, no mass, nontender Vascular: No leg edema   Lab Results:  Lab Results  Component Value Date   WBC 3.2 (L) 11/30/2022   HGB 13.9 11/30/2022   HCT 40.9 11/30/2022   MCV 98.6 11/30/2022   PLT 292 11/30/2022   NEUTROABS 2.0 11/30/2022    CMP  Lab Results  Component Value Date   NA 140 11/30/2022   K 4.4 11/30/2022   CL 103 11/30/2022   CO2 29 11/30/2022   GLUCOSE 89 11/30/2022   BUN 14 11/30/2022   CREATININE 0.90 11/30/2022   CALCIUM 9.4 11/30/2022   PROT 7.2 11/30/2022   ALBUMIN 4.4 11/30/2022   AST 16 11/30/2022   ALT 11 11/30/2022   ALKPHOS 52 11/30/2022   BILITOT 1.1 11/30/2022   GFRNONAA >60 11/30/2022   GFRAA 101 09/25/2019     Medications: I have reviewed the patient's current medications.   Assessment/Plan: Low-grade B-cell non-Hodgkin's lymphoma Palpable lymphadenopathy IgG kappa serum M spike with elevated free kappa light chain Bone marrow biopsy 12/05/2021-hypercellular marrow,  95%, diffusely infiltrated by B-cell lymphoma, no expression of CD5 or CD10, CD20 positive.  Differential diagnosis includes marginal zone lymphoma and lymphoplasmacytic lymphoma, flow cytometry identified a kappa restricted B-cell population compromising 80% of lymphocytes, 43 XY karyotype,MYD88 positive CTs 12/08/2021-lipoma to left lower neck/upper chest, hepatomegaly with numerous liver cysts, prominent right inguinal node, no splenomegaly, no suspicious bone lesions, 9 mm focus of hyperenhancement in the left hepatic lobe, small right adrenal nodule Cycle 1 Bendamustine/rituximab 12/18/2021 Cycle 2 Bendamustine/rituximab 01/15/2022 Cycle 3 Bendamustine/rituximab 02/12/2022, Bendamustine dose escalated to 90 mg per metered squared Cycle 4 Bendamustine/rituximab 03/19/2022-rituximab given over 2 days secondary to infusion rate related allergic reaction Bone marrow biopsy 04/21/2022-persistent involvement by low-grade B-cell lymphoma, improved, flow cytometry confirmed a monoclonal kappa restricted B-cell population, CD138 highlights a minor plasma cell component within the lymphoid infiltrates, the plasma cells have kappa light chain restriction PET at [facility] 08/28/2022-right hilar and bilateral axillary nodes with minimal uptake, no suspicious hypermetabolic lymph nodes in the abdomen or pelvis, mild diffuse marrow uptake comparison to the PET from 12/22/2021-bilateral axillary, subpectoral, right hilar, and abdominal lymph nodes have decreased uptake consistent with treatment response, Deauville 2, creased bone marrow uptake  2.   Macrocytic anemia secondary to #1 3.   Mild thrombocytosis 4.   History of prostate cancer 5.   Malaise secondary to #1 6.   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  He appears stable.  The hemoglobin is higher and he appears asymptomatic from lymphoma.  The inguinal lymph nodes may be slightly more prominent.  We will follow-up on the myeloma panel obtained earlier this week.  The plan is to continue observation.  He will return for an office visit in 3 months.  He does not wish to undergo a restaging PET scan.  We will relay this decision to the [facility] team.  We will follow him with lab and clinical evaluations.  We will consider restaging CTs if he develops significant evidence of disease progression.  [name], MD  12/03/2022  8:59 AM

## 2023-03-08 ENCOUNTER — Inpatient Hospital Stay: Payer: BC Managed Care – PPO | Attending: Oncology

## 2023-03-08 DIAGNOSIS — C8510 Unspecified B-cell lymphoma, unspecified site: Secondary | ICD-10-CM | POA: Insufficient documentation

## 2023-03-08 DIAGNOSIS — C8515 Unspecified B-cell lymphoma, lymph nodes of inguinal region and lower limb: Secondary | ICD-10-CM

## 2023-03-08 LAB — CBC WITH DIFFERENTIAL (CANCER CENTER ONLY)
Abs Immature Granulocytes: 0.01 10*3/uL (ref 0.00–0.07)
Basophils Absolute: 0 10*3/uL (ref 0.0–0.1)
Basophils Relative: 1 %
Eosinophils Absolute: 0.2 10*3/uL (ref 0.0–0.5)
Eosinophils Relative: 5 %
HCT: 39.4 % (ref 39.0–52.0)
Hemoglobin: 13.8 g/dL (ref 13.0–17.0)
Immature Granulocytes: 0 %
Lymphocytes Relative: 16 %
Lymphs Abs: 0.6 10*3/uL — ABNORMAL LOW (ref 0.7–4.0)
MCH: 33.8 pg (ref 26.0–34.0)
MCHC: 35 g/dL (ref 30.0–36.0)
MCV: 96.6 fL (ref 80.0–100.0)
Monocytes Absolute: 0.5 10*3/uL (ref 0.1–1.0)
Monocytes Relative: 14 %
Neutro Abs: 2.4 10*3/uL (ref 1.7–7.7)
Neutrophils Relative %: 64 %
Platelet Count: 277 10*3/uL (ref 150–400)
RBC: 4.08 MIL/uL — ABNORMAL LOW (ref 4.22–5.81)
RDW: 12.9 % (ref 11.5–15.5)
WBC Count: 3.7 10*3/uL — ABNORMAL LOW (ref 4.0–10.5)
nRBC: 0 % (ref 0.0–0.2)

## 2023-03-08 LAB — CMP (CANCER CENTER ONLY)
ALT: 13 U/L (ref 0–44)
AST: 18 U/L (ref 15–41)
Albumin: 4.4 g/dL (ref 3.5–5.0)
Alkaline Phosphatase: 60 U/L (ref 38–126)
Anion gap: 5 (ref 5–15)
BUN: 18 mg/dL (ref 8–23)
CO2: 31 mmol/L (ref 22–32)
Calcium: 9.1 mg/dL (ref 8.9–10.3)
Chloride: 102 mmol/L (ref 98–111)
Creatinine: 0.89 mg/dL (ref 0.61–1.24)
GFR, Estimated: >60 mL/min (ref >60)
Glucose, Bld: 91 mg/dL (ref 70–99)
Potassium: 4.4 mmol/L (ref 3.5–5.1)
Sodium: 138 mmol/L (ref 135–145)
Total Bilirubin: 1.3 mg/dL — ABNORMAL HIGH (ref 0.3–1.2)
Total Protein: 6.8 g/dL (ref 6.5–8.1)

## 2023-03-08 LAB — LACTATE DEHYDROGENASE: LDH: 163 U/L (ref 98–192)

## 2023-03-09 LAB — KAPPA/LAMBDA LIGHT CHAINS
Kappa free light chain: 45 mg/L — ABNORMAL HIGH (ref 3.3–19.4)
Kappa, lambda light chain ratio: 10 — ABNORMAL HIGH (ref 0.26–1.65)
Lambda free light chains: 4.5 mg/L — ABNORMAL LOW (ref 5.7–26.3)

## 2023-03-10 ENCOUNTER — Inpatient Hospital Stay: Payer: BC Managed Care – PPO | Attending: Oncology | Admitting: Oncology

## 2023-03-10 VITALS — BP 134/82 | HR 69 | Temp 98.1°F | Resp 18 | Ht 72.0 in | Wt 170.7 lb

## 2023-03-10 DIAGNOSIS — C8590 Non-Hodgkin lymphoma, unspecified, unspecified site: Secondary | ICD-10-CM | POA: Insufficient documentation

## 2023-03-10 DIAGNOSIS — C8515 Unspecified B-cell lymphoma, lymph nodes of inguinal region and lower limb: Secondary | ICD-10-CM | POA: Diagnosis not present

## 2023-03-10 NOTE — Progress Notes (Signed)
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  He generally feels well.  No fever or night sweats.  No change in palpable lymph nodes in the groin.  He is exercising.  Good appetite.  Objective:  Vital signs in last 24 hours:  Blood pressure 134/82, pulse 69, temperature 98.1 F (36.7 C), temperature source Oral, resp. rate 18, height 6' (1.829 m), weight 170 lb 11.2 oz (77.4 kg), SpO2 100 %.    Lymphatics: 1-1.5 cm bilateral (right greater than left) axillary nodes, 1-1.5 cm right greater than left inguinal and upper femoral nodes.  No cervical or supraclavicular nodes Resp: Lungs clear bilaterally Cardio: Regular rate and rhythm GI: No hepatosplenomegaly Vascular: No leg edema   Lab Results:  Lab Results  Component Value Date   WBC 3.7 (L) 03/08/2023   HGB 13.8 03/08/2023   HCT 39.4 03/08/2023   MCV 96.6 03/08/2023   PLT 277 03/08/2023   NEUTROABS 2.4 03/08/2023    CMP  Lab Results  Component Value Date   NA 138 03/08/2023   K 4.4 03/08/2023   CL 102 03/08/2023   CO2 31 03/08/2023   GLUCOSE 91 03/08/2023   BUN 18 03/08/2023   CREATININE 0.89 03/08/2023   CALCIUM 9.1 03/08/2023   PROT 6.8 03/08/2023   ALBUMIN 4.4 03/08/2023   AST 18 03/08/2023   ALT 13 03/08/2023   ALKPHOS 60 03/08/2023   BILITOT 1.3 (H) 03/08/2023   GFRNONAA >60 03/08/2023   GFRAA 101 09/25/2019    Medications: I have reviewed the patient's current medications.   Assessment/Plan: Low-grade B-cell non-Hodgkin's lymphoma Palpable lymphadenopathy IgG kappa serum M spike with elevated free kappa light chain Bone marrow biopsy 12/05/2021-hypercellular marrow, 95%, diffusely infiltrated by B-cell lymphoma, no expression of CD5 or CD10, CD20 positive.  Differential diagnosis includes marginal zone lymphoma and lymphoplasmacytic lymphoma, flow cytometry identified a kappa restricted B-cell population  compromising 80% of lymphocytes, 46 XY karyotype,MYD88 positive CTs 12/08/2021-lipoma to left lower neck/upper chest, hepatomegaly with numerous liver cysts, prominent right inguinal node, no splenomegaly, no suspicious bone lesions, 9 mm focus of hyperenhancement in the left hepatic lobe, small right adrenal nodule Cycle 1 Bendamustine/rituximab 12/18/2021 Cycle 2 Bendamustine/rituximab 01/15/2022 Cycle 3 Bendamustine/rituximab 02/12/2022, Bendamustine dose escalated to 90 mg per metered squared Cycle 4 Bendamustine/rituximab 03/19/2022-rituximab given over 2 days secondary to infusion rate related allergic reaction Bone marrow biopsy 04/21/2022-persistent involvement by low-grade B-cell lymphoma, improved, flow cytometry confirmed a monoclonal kappa restricted B-cell population, CD138 highlights a minor plasma cell component within the lymphoid infiltrates, the plasma cells have kappa light chain restriction PET at [facility] 08/28/2022-right hilar and bilateral axillary nodes with minimal uptake, no suspicious hypermetabolic lymph nodes in the abdomen or pelvis, mild diffuse marrow uptake comparison to the PET from 12/22/2021-bilateral axillary, subpectoral, right hilar, and abdominal lymph nodes have decreased uptake consistent with treatment response, Deauville 2, decreased bone marrow uptake  2.   Macrocytic anemia secondary to #1 3.   Mild thrombocytosis 4.   History of prostate cancer 5.   Malaise secondary to #1 6.   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  There is no clinical or laboratory evidence for progression of lymphoma.  The plan is to continue observation.  We will follow-up on the myeloma panel from today.  Dr.  [name] will return for an office and lab visit in 4 months.  He will call in the interim for new symptoms.  Ethan Papas, MD  03/10/2023  9:14 AM

## 2023-03-12 LAB — MULTIPLE MYELOMA PANEL, SERUM
Albumin SerPl Elph-Mcnc: 3.8 g/dL (ref 2.9–4.4)
Albumin/Glob SerPl: 1.5 (ref 0.7–1.7)
Alpha 1: 0.2 g/dL (ref 0.0–0.4)
Alpha2 Glob SerPl Elph-Mcnc: 0.5 g/dL (ref 0.4–1.0)
B-Globulin SerPl Elph-Mcnc: 0.7 g/dL (ref 0.7–1.3)
Gamma Glob SerPl Elph-Mcnc: 1.1 g/dL (ref 0.4–1.8)
Globulin, Total: 2.6 g/dL (ref 2.2–3.9)
IgA: 24 mg/dL — ABNORMAL LOW (ref 61–437)
IgG (Immunoglobin G), Serum: 1392 mg/dL (ref 603–1613)
IgM (Immunoglobulin M), Srm: 36 mg/dL (ref 20–172)
M Protein SerPl Elph-Mcnc: 0.7 g/dL — ABNORMAL HIGH
Total Protein ELP: 6.4 g/dL (ref 6.0–8.5)

## 2023-07-06 ENCOUNTER — Inpatient Hospital Stay: Payer: BC Managed Care – PPO

## 2023-07-07 ENCOUNTER — Inpatient Hospital Stay: Payer: BC Managed Care – PPO | Attending: Oncology

## 2023-07-07 DIAGNOSIS — C8515 Unspecified B-cell lymphoma, lymph nodes of inguinal region and lower limb: Secondary | ICD-10-CM

## 2023-07-07 DIAGNOSIS — C8510 Unspecified B-cell lymphoma, unspecified site: Secondary | ICD-10-CM | POA: Diagnosis not present

## 2023-07-07 DIAGNOSIS — L821 Other seborrheic keratosis: Secondary | ICD-10-CM | POA: Diagnosis not present

## 2023-07-07 DIAGNOSIS — Z85828 Personal history of other malignant neoplasm of skin: Secondary | ICD-10-CM | POA: Diagnosis not present

## 2023-07-07 DIAGNOSIS — D2271 Melanocytic nevi of right lower limb, including hip: Secondary | ICD-10-CM | POA: Diagnosis not present

## 2023-07-07 DIAGNOSIS — D235 Other benign neoplasm of skin of trunk: Secondary | ICD-10-CM | POA: Diagnosis not present

## 2023-07-07 DIAGNOSIS — C44722 Squamous cell carcinoma of skin of right lower limb, including hip: Secondary | ICD-10-CM | POA: Diagnosis not present

## 2023-07-07 DIAGNOSIS — D040 Carcinoma in situ of skin of lip: Secondary | ICD-10-CM | POA: Diagnosis not present

## 2023-07-07 LAB — CBC WITH DIFFERENTIAL (CANCER CENTER ONLY)
Abs Immature Granulocytes: 0.01 10*3/uL (ref 0.00–0.07)
Basophils Absolute: 0 10*3/uL (ref 0.0–0.1)
Basophils Relative: 1 %
Eosinophils Absolute: 0.1 10*3/uL (ref 0.0–0.5)
Eosinophils Relative: 2 %
HCT: 41.8 % (ref 39.0–52.0)
Hemoglobin: 14.4 g/dL (ref 13.0–17.0)
Immature Granulocytes: 0 %
Lymphocytes Relative: 15 %
Lymphs Abs: 0.6 10*3/uL — ABNORMAL LOW (ref 0.7–4.0)
MCH: 33.1 pg (ref 26.0–34.0)
MCHC: 34.4 g/dL (ref 30.0–36.0)
MCV: 96.1 fL (ref 80.0–100.0)
Monocytes Absolute: 0.5 10*3/uL (ref 0.1–1.0)
Monocytes Relative: 12 %
Neutro Abs: 2.7 10*3/uL (ref 1.7–7.7)
Neutrophils Relative %: 70 %
Platelet Count: 278 10*3/uL (ref 150–400)
RBC: 4.35 MIL/uL (ref 4.22–5.81)
RDW: 12.8 % (ref 11.5–15.5)
WBC Count: 3.9 10*3/uL — ABNORMAL LOW (ref 4.0–10.5)
nRBC: 0 % (ref 0.0–0.2)

## 2023-07-07 LAB — CMP (CANCER CENTER ONLY)
ALT: 14 U/L (ref 0–44)
AST: 18 U/L (ref 15–41)
Albumin: 4.7 g/dL (ref 3.5–5.0)
Alkaline Phosphatase: 52 U/L (ref 38–126)
Anion gap: 7 (ref 5–15)
BUN: 16 mg/dL (ref 8–23)
CO2: 30 mmol/L (ref 22–32)
Calcium: 9.5 mg/dL (ref 8.9–10.3)
Chloride: 103 mmol/L (ref 98–111)
Creatinine: 1.01 mg/dL (ref 0.61–1.24)
GFR, Estimated: >60 mL/min (ref >60)
Glucose, Bld: 96 mg/dL (ref 70–99)
Potassium: 4.4 mmol/L (ref 3.5–5.1)
Sodium: 140 mmol/L (ref 135–145)
Total Bilirubin: 1.4 mg/dL — ABNORMAL HIGH (ref 0.3–1.2)
Total Protein: 7.5 g/dL (ref 6.5–8.1)

## 2023-07-07 LAB — LACTATE DEHYDROGENASE: LDH: 228 U/L — ABNORMAL HIGH (ref 98–192)

## 2023-07-08 LAB — IGG: IgG (Immunoglobin G), Serum: 1532 mg/dL (ref 603–1613)

## 2023-07-09 LAB — PROTEIN ELECTROPHORESIS, SERUM
A/G Ratio: 1.4 (ref 0.7–1.7)
Albumin ELP: 4.2 g/dL (ref 2.9–4.4)
Alpha-1-Globulin: 0.2 g/dL (ref 0.0–0.4)
Alpha-2-Globulin: 0.5 g/dL (ref 0.4–1.0)
Beta Globulin: 0.8 g/dL (ref 0.7–1.3)
Gamma Globulin: 1.4 g/dL (ref 0.4–1.8)
Globulin, Total: 2.9 g/dL (ref 2.2–3.9)
M-Spike, %: 1.1 g/dL — ABNORMAL HIGH
Total Protein ELP: 7.1 g/dL (ref 6.0–8.5)

## 2023-07-09 LAB — KAPPA/LAMBDA LIGHT CHAINS
Kappa free light chain: 50.3 mg/L — ABNORMAL HIGH (ref 3.3–19.4)
Kappa, lambda light chain ratio: 13.24 — ABNORMAL HIGH (ref 0.26–1.65)
Lambda free light chains: 3.8 mg/L — ABNORMAL LOW (ref 5.7–26.3)

## 2023-07-14 ENCOUNTER — Inpatient Hospital Stay: Payer: BC Managed Care – PPO

## 2023-07-14 ENCOUNTER — Inpatient Hospital Stay: Payer: BC Managed Care – PPO | Attending: Oncology | Admitting: Oncology

## 2023-07-14 VITALS — BP 131/77 | HR 77 | Temp 97.9°F | Resp 18 | Ht 72.0 in | Wt 170.0 lb

## 2023-07-14 DIAGNOSIS — Z23 Encounter for immunization: Secondary | ICD-10-CM | POA: Insufficient documentation

## 2023-07-14 DIAGNOSIS — C8515 Unspecified B-cell lymphoma, lymph nodes of inguinal region and lower limb: Secondary | ICD-10-CM | POA: Diagnosis not present

## 2023-07-14 MED ORDER — INFLUENZA VAC SPLIT QUAD 0.5 ML IM SUSY: 0.5000 mL | PREFILLED_SYRINGE | Freq: Once | INTRAMUSCULAR | Status: DC

## 2023-07-14 MED ORDER — INFLUENZA VIRUS VACC SPLIT PF (FLUZONE) 0.5 ML IM SUSY: 0.5000 mL | PREFILLED_SYRINGE | INTRAMUSCULAR | Status: DC

## 2023-07-14 MED ORDER — INFLUENZA VIRUS VACC SPLIT PF (FLUZONE) 0.5 ML IM SUSY
0.5000 mL | PREFILLED_SYRINGE | INTRAMUSCULAR | Status: AC
  Administered 2023-07-14: 0.5 mL via INTRAMUSCULAR

## 2023-07-14 NOTE — Progress Notes (Signed)
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  He generally feels well.  Good appetite.  No fever or night sweats.  He has noted mild enlargement of a right inguinal lymph node.  He recently had a squamous cell carcinoma removed from the right lower leg and is treating a basal cell carcinoma of the right lip with a topical preparation.  Objective:  Vital signs in last 24 hours:  Blood pressure 131/77, pulse 77, temperature 97.9 F (36.6 C), temperature source Oral, resp. rate 18, height 6' (1.829 m), weight 170 lb (77.1 kg), SpO2 100%.    HEENT: Left neck lipoma Lymphatics: No cervical or supraclavicular nodes.  1-1.5 cm bilateral axillary nodes.  Shotty and 1 cm bilateral inguinal nodes, 1 cm high left femoral node Resp: Lungs clear bilaterally Cardio: Regular rate and rhythm GI: No hepatosplenomegaly Vascular: No leg edema  Skin: Healing biopsy site of the right lower leg   Lab Results:  Lab Results  Component Value Date   WBC 3.9 (L) 07/07/2023   HGB 14.4 07/07/2023   HCT 41.8 07/07/2023   MCV 96.1 07/07/2023   PLT 278 07/07/2023   NEUTROABS 2.7 07/07/2023    CMP  Lab Results  Component Value Date   NA 140 07/07/2023   K 4.4 07/07/2023   CL 103 07/07/2023   CO2 30 07/07/2023   GLUCOSE 96 07/07/2023   BUN 16 07/07/2023   CREATININE 1.01 07/07/2023   CALCIUM 9.5 07/07/2023   PROT 7.5 07/07/2023   ALBUMIN 4.7 07/07/2023   AST 18 07/07/2023   ALT 14 07/07/2023   ALKPHOS 52 07/07/2023   BILITOT 1.4 (H) 07/07/2023   GFRNONAA >60 07/07/2023   GFRAA 101 09/25/2019     Medications: I have reviewed the patient's current medications.   Assessment/Plan: Low-grade B-cell non-Hodgkin's lymphoma Palpable lymphadenopathy IgG kappa serum M spike with elevated free kappa light chain Bone marrow biopsy 12/05/2021-hypercellular marrow, 95%, diffusely infiltrated by B-cell  lymphoma, no expression of CD5 or CD10, CD20 positive.  Differential diagnosis includes marginal zone lymphoma and lymphoplasmacytic lymphoma, flow cytometry identified a kappa restricted B-cell population compromising 80% of lymphocytes, 46 XY karyotype,MYD88 positive CTs 12/08/2021-lipoma to left lower neck/upper chest, hepatomegaly with numerous liver cysts, prominent right inguinal node, no splenomegaly, no suspicious bone lesions, 9 mm focus of hyperenhancement in the left hepatic lobe, small right adrenal nodule Cycle 1 Bendamustine/rituximab 12/18/2021 Cycle 2 Bendamustine/rituximab 01/15/2022 Cycle 3 Bendamustine/rituximab 02/12/2022, Bendamustine dose escalated to 90 mg per metered squared Cycle 4 Bendamustine/rituximab 03/19/2022-rituximab given over 2 days secondary to infusion rate related allergic reaction Bone marrow biopsy 04/21/2022-persistent involvement by low-grade B-cell lymphoma, improved, flow cytometry confirmed a monoclonal kappa restricted B-cell population, CD138 highlights a minor plasma cell component within the lymphoid infiltrates, the plasma cells have kappa light chain restriction PET at [facility] 08/28/2022-right hilar and bilateral axillary nodes with minimal uptake, no suspicious hypermetabolic lymph nodes in the abdomen or pelvis, mild diffuse marrow uptake comparison to the PET from 12/22/2021-bilateral axillary, subpectoral, right hilar, and abdominal lymph nodes have decreased uptake consistent with treatment response, Deauville 2, decreased bone marrow uptake  2.   Macrocytic anemia secondary to #1 3.   Mild thrombocytosis 4.   History of prostate cancer 5.   Malaise secondary to #1 6.   Pruritus/rash during rituximab infusion 12/18/2021-improved with additional Benadryl, Pepcid, completed rituximab infusion, recurrent allergy symptoms during subsequent rituximab infusions-infusions completed at  a slower rate with additional steroids and antihistamine therapy  Disposition: Dr.  [name] remains in partial clinical remission from the low-grade non-Hodgkin's lymphoma.  The measures of the serum M protein have not changed significantly over the past 4 months.  The palpable lymphadenopathy is unchanged.  The plan is to continue observation.  He will call for new symptoms. [name] received an influenza vaccine today.  He will return for an office and lab visit in 4 months.  Ethan Papas, MD  07/14/2023  9:25 AM

## 2023-08-23 NOTE — Telephone Encounter (Signed)
errior

## 2023-10-01 ENCOUNTER — Other Ambulatory Visit: Payer: Self-pay | Admitting: *Deleted

## 2023-10-01 ENCOUNTER — Encounter: Payer: Self-pay | Admitting: *Deleted

## 2023-10-01 DIAGNOSIS — C8515 Unspecified B-cell lymphoma, lymph nodes of inguinal region and lower limb: Secondary | ICD-10-CM

## 2023-10-01 NOTE — Progress Notes (Signed)
Patient requested PSA at next lab draw. OK per Dr. Truett Perna. Order placed.

## 2023-10-21 IMAGING — PT NM PET TUM IMG INITIAL (PI) SKULL BASE T - THIGH
1 of 7 series · 1 of 25 positions shown · non-contrast
Comparison: Chest abdomen and pelvic CTs of 12/08/2021

CLINICAL DATA: Initial treatment strategy for staging of
non-Hodgkin's lymphoma.

EXAM:
NUCLEAR MEDICINE PET SKULL BASE TO THIGH
TECHNIQUE: 8.4 mCi F-18 FDG was injected intravenously. Full-ring PET imaging
was performed from the skull base to thigh after the radiotracer. CT
data was obtained and used for attenuation correction and anatomic
localization.
Fasting blood glucose: 107 mg/dl

[Series 4: ct sk_thigh 5.0 bf37 · axial · 5.0mm · 0.98mm/px · 1 of 242 slices shown]
[im 242/242  brain]
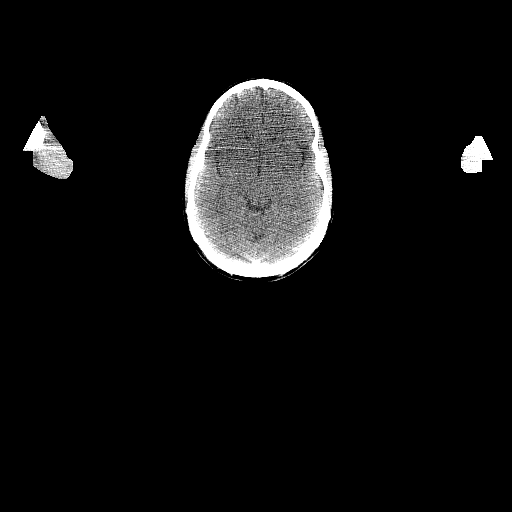

[1 of 25 positions shown; findings below may reference images not displayed]

FINDINGS: Mediastinal blood pool activity: SUV max

Liver activity: SUV max

NECK: No areas of abnormal hypermetabolism.

Incidental CT findings: No cervical adenopathy. Small bilateral
posterior triangle nodes.

CHEST: Small left subpectoral nodes including at up to 7 mm and a
S.U.V. max of 1.7 on 68/4.

Multiple left axillary nodes including at up to 9 mm and a S.U.V.
max of 1.7 on 66/4.

Right hilar node measures 1.0 cm on the prior diagnostic CT and a
S.U.V. max of 3.2 on 79/4.

Incidental CT findings: Deferred to recent diagnostic CT. Again
identified is a left supraclavicular lipoma, including at 7.1 cm on
52/4. This extends into the thoracic inlet. Lad coronary artery
calcification.

ABDOMEN/PELVIS: No splenic hypermetabolism. Subtle abdominal
retroperitoneal nodal tissue with low-level hypermetabolism. Example
retroaortic 1.0 cm node on 143/4 corresponding to hypermetabolism at
a S.U.V. max of 2.6.

Right inguinal node measures 1.2 cm and a S.U.V. max of 2.0 on
206/4. Left inguinal node measures 9 mm and a S.U.V. max of 2.1 on
206/4.

Incidental CT findings: Deferred to recent diagnostic CT. Abdominal
aortic atherosclerosis. Hepatic cysts. Interpolar right renal cyst.
Prostatectomy.

SKELETON: No abnormal marrow activity.

Incidental CT findings: none
IMPRESSION: 1. Low-level hypermetabolism within thoracic, abdominal, and pelvic
nodes, as detailed above. Although these could be reactive, given
the clinical history and node distribution, low-grade lymphoma is a
concern. ([HOSPITAL]) 4
2. Coronary artery atherosclerosis. Aortic Atherosclerosis
([id number]).

## 2023-11-16 ENCOUNTER — Other Ambulatory Visit: Payer: BC Managed Care – PPO

## 2023-11-16 ENCOUNTER — Inpatient Hospital Stay: Payer: BC Managed Care – PPO | Attending: Oncology

## 2023-11-16 DIAGNOSIS — R5381 Other malaise: Secondary | ICD-10-CM | POA: Diagnosis not present

## 2023-11-16 DIAGNOSIS — D539 Nutritional anemia, unspecified: Secondary | ICD-10-CM | POA: Diagnosis not present

## 2023-11-16 DIAGNOSIS — Z8546 Personal history of malignant neoplasm of prostate: Secondary | ICD-10-CM | POA: Diagnosis not present

## 2023-11-16 DIAGNOSIS — C8598 Non-Hodgkin lymphoma, unspecified, lymph nodes of multiple sites: Secondary | ICD-10-CM | POA: Diagnosis not present

## 2023-11-16 DIAGNOSIS — D75839 Thrombocytosis, unspecified: Secondary | ICD-10-CM | POA: Diagnosis not present

## 2023-11-16 DIAGNOSIS — C8515 Unspecified B-cell lymphoma, lymph nodes of inguinal region and lower limb: Secondary | ICD-10-CM

## 2023-11-16 LAB — CMP (CANCER CENTER ONLY)
ALT: 11 U/L (ref 0–44)
AST: 14 U/L — ABNORMAL LOW (ref 15–41)
Albumin: 4.4 g/dL (ref 3.5–5.0)
Alkaline Phosphatase: 48 U/L (ref 38–126)
Anion gap: 7 (ref 5–15)
BUN: 14 mg/dL (ref 8–23)
CO2: 29 mmol/L (ref 22–32)
Calcium: 8.9 mg/dL (ref 8.9–10.3)
Chloride: 103 mmol/L (ref 98–111)
Creatinine: 0.75 mg/dL (ref 0.61–1.24)
GFR, Estimated: >60 mL/min (ref >60)
Glucose, Bld: 109 mg/dL — ABNORMAL HIGH (ref 70–99)
Potassium: 4.1 mmol/L (ref 3.5–5.1)
Sodium: 139 mmol/L (ref 135–145)
Total Bilirubin: 1.3 mg/dL — ABNORMAL HIGH (ref 0.0–1.2)
Total Protein: 7.2 g/dL (ref 6.5–8.1)

## 2023-11-16 LAB — CBC WITH DIFFERENTIAL (CANCER CENTER ONLY)
Abs Immature Granulocytes: 0.01 10*3/uL (ref 0.00–0.07)
Basophils Absolute: 0 10*3/uL (ref 0.0–0.1)
Basophils Relative: 1 %
Eosinophils Absolute: 0.1 10*3/uL (ref 0.0–0.5)
Eosinophils Relative: 3 %
HCT: 40.4 % (ref 39.0–52.0)
Hemoglobin: 13.6 g/dL (ref 13.0–17.0)
Immature Granulocytes: 0 %
Lymphocytes Relative: 16 %
Lymphs Abs: 0.5 10*3/uL — ABNORMAL LOW (ref 0.7–4.0)
MCH: 32.6 pg (ref 26.0–34.0)
MCHC: 33.7 g/dL (ref 30.0–36.0)
MCV: 96.9 fL (ref 80.0–100.0)
Monocytes Absolute: 0.6 10*3/uL (ref 0.1–1.0)
Monocytes Relative: 16 %
Neutro Abs: 2.1 10*3/uL (ref 1.7–7.7)
Neutrophils Relative %: 64 %
Platelet Count: 270 10*3/uL (ref 150–400)
RBC: 4.17 MIL/uL — ABNORMAL LOW (ref 4.22–5.81)
RDW: 13.3 % (ref 11.5–15.5)
WBC Count: 3.4 10*3/uL — ABNORMAL LOW (ref 4.0–10.5)
nRBC: 0 % (ref 0.0–0.2)

## 2023-11-16 LAB — LACTATE DEHYDROGENASE: LDH: 139 U/L (ref 98–192)

## 2023-11-17 LAB — KAPPA/LAMBDA LIGHT CHAINS
Kappa free light chain: 66.1 mg/L — ABNORMAL HIGH (ref 3.3–19.4)
Kappa, lambda light chain ratio: 16.95 — ABNORMAL HIGH (ref 0.26–1.65)
Lambda free light chains: 3.9 mg/L — ABNORMAL LOW (ref 5.7–26.3)

## 2023-11-17 LAB — PROSTATE-SPECIFIC AG, SERUM (LABCORP): Prostate Specific Ag, Serum: <0.1 ng/mL (ref 0.0–4.0)

## 2023-11-18 LAB — IGG: IgG (Immunoglobin G), Serum: 1452 mg/dL (ref 603–1613)

## 2023-11-22 LAB — PROTEIN ELECTROPHORESIS, SERUM
A/G Ratio: 1.5 (ref 0.7–1.7)
Albumin ELP: 4 g/dL (ref 2.9–4.4)
Alpha-1-Globulin: 0.2 g/dL (ref 0.0–0.4)
Alpha-2-Globulin: 0.5 g/dL (ref 0.4–1.0)
Beta Globulin: 0.7 g/dL (ref 0.7–1.3)
Gamma Globulin: 1.2 g/dL (ref 0.4–1.8)
Globulin, Total: 2.7 g/dL (ref 2.2–3.9)
M-Spike, %: 0.8 g/dL — ABNORMAL HIGH
Total Protein ELP: 6.7 g/dL (ref 6.0–8.5)

## 2023-11-23 ENCOUNTER — Inpatient Hospital Stay (HOSPITAL_BASED_OUTPATIENT_CLINIC_OR_DEPARTMENT_OTHER): Payer: BC Managed Care – PPO | Admitting: Oncology

## 2023-11-23 VITALS — BP 125/78 | HR 68 | Temp 98.2°F | Resp 18 | Ht 72.0 in | Wt 175.9 lb

## 2023-11-23 DIAGNOSIS — D539 Nutritional anemia, unspecified: Secondary | ICD-10-CM | POA: Diagnosis not present

## 2023-11-23 DIAGNOSIS — C8598 Non-Hodgkin lymphoma, unspecified, lymph nodes of multiple sites: Secondary | ICD-10-CM | POA: Diagnosis not present

## 2023-11-23 DIAGNOSIS — D75839 Thrombocytosis, unspecified: Secondary | ICD-10-CM | POA: Diagnosis not present

## 2023-11-23 DIAGNOSIS — Z8546 Personal history of malignant neoplasm of prostate: Secondary | ICD-10-CM | POA: Diagnosis not present

## 2023-11-23 DIAGNOSIS — R5381 Other malaise: Secondary | ICD-10-CM | POA: Diagnosis not present

## 2023-11-23 DIAGNOSIS — C8515 Unspecified B-cell lymphoma, lymph nodes of inguinal region and lower limb: Secondary | ICD-10-CM | POA: Diagnosis not present

## 2023-11-23 NOTE — Progress Notes (Signed)
 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  He generally feels well.  Good appetite.  He is exercising.  No fever or night sweats.  The groin lymph nodes are larger.  Objective:  Vital signs in last 24 hours:  Blood pressure 125/78, pulse 68, temperature 98.2 F (36.8 C), temperature source Temporal, resp. rate 18, height 6' (1.829 m), weight 175 lb 14.4 oz (79.8 kg), SpO2 97%.    Lymphatics: No cervical or supraclavicular nodes, 1-1.5 cm bilateral axillary nodes versus prominent fat pads.,  Bilateral upper femoral/inguinal nodes measuring 1-3 cm Resp: Lungs clear bilaterally Cardio: Regular rate and rhythm GI: No hepatosplenomegaly, nontender, no mass Vascular: No leg edema  Lab Results:  Lab Results  Component Value Date   WBC 3.4 (L) 11/16/2023   HGB 13.6 11/16/2023   HCT 40.4 11/16/2023   MCV 96.9 11/16/2023   PLT 270 11/16/2023   NEUTROABS 2.1 11/16/2023    CMP  Lab Results  Component Value Date   NA 139 11/16/2023   K 4.1 11/16/2023   CL 103 11/16/2023   CO2 29 11/16/2023   GLUCOSE 109 (H) 11/16/2023   BUN 14 11/16/2023   CREATININE 0.75 11/16/2023   CALCIUM 8.9 11/16/2023   PROT 7.2 11/16/2023   ALBUMIN 4.4 11/16/2023   AST 14 (L) 11/16/2023   ALT 11 11/16/2023   ALKPHOS 48 11/16/2023   BILITOT 1.3 (H) 11/16/2023   GFRNONAA >60 11/16/2023   GFRAA 101 09/25/2019   Medications: I have reviewed the patient's current medications.   Assessment/Plan: Low-grade B-cell non-Hodgkin's lymphoma Palpable lymphadenopathy IgG kappa serum M spike with elevated free kappa light chain Bone marrow biopsy 12/05/2021-hypercellular marrow, 95%, diffusely infiltrated by B-cell lymphoma, no expression of CD5 or CD10, CD20 positive.  Differential diagnosis includes marginal zone lymphoma and lymphoplasmacytic lymphoma, flow cytometry identified a kappa restricted B-cell  population compromising 80% of lymphocytes, 46 XY karyotype,MYD88 positive CTs 12/08/2021-lipoma to left lower neck/upper chest, hepatomegaly with numerous liver cysts, prominent right inguinal node, no splenomegaly, no suspicious bone lesions, 9 mm focus of hyperenhancement in the left hepatic lobe, small right adrenal nodule Cycle 1 Bendamustine /rituximab  12/18/2021 Cycle 2 Bendamustine /rituximab  01/15/2022 Cycle 3 Bendamustine /rituximab  02/12/2022, Bendamustine  dose escalated to 90 mg per metered squared Cycle 4 Bendamustine /rituximab  03/19/2022-rituximab  given over 2 days secondary to infusion rate related allergic reaction Bone marrow biopsy 04/21/2022-persistent involvement by low-grade B-cell lymphoma, improved, flow cytometry confirmed a monoclonal kappa restricted B-cell population, CD138 highlights a minor plasma cell component within the lymphoid infiltrates, the plasma cells have kappa light chain restriction PET at [facility] 08/28/2022-right hilar and bilateral axillary nodes with minimal uptake, no suspicious hypermetabolic lymph nodes in the abdomen or pelvis, mild diffuse marrow uptake comparison to the PET from 12/22/2021-bilateral axillary, subpectoral, right hilar, and abdominal lymph nodes have decreased uptake consistent with treatment response, Deauville 2, decreased bone marrow uptake  2.   Macrocytic anemia secondary to #1 3.   Mild thrombocytosis 4.   History of prostate cancer 5.   Malaise secondary to #1 6.   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  The palpable groin nodes are larger, but he is otherwise asymptomatic from lymphoma.  He is stable from a hematologic standpoint the LDH is  normal,  and the serum M spike is stable.  I recommend continued observation.  He is in agreement.  He will return for an office visit in 4 months.  He will call for other enlargement of the palpable lymph nodes or new symptoms.  Ethan Hof, MD  11/23/2023  3:28 PM

## 2024-03-20 ENCOUNTER — Inpatient Hospital Stay: Payer: BC Managed Care – PPO | Attending: Oncology

## 2024-03-20 DIAGNOSIS — R530 Neoplastic (malignant) related fatigue: Secondary | ICD-10-CM | POA: Diagnosis not present

## 2024-03-20 DIAGNOSIS — C8510 Unspecified B-cell lymphoma, unspecified site: Secondary | ICD-10-CM | POA: Insufficient documentation

## 2024-03-20 DIAGNOSIS — D75839 Thrombocytosis, unspecified: Secondary | ICD-10-CM | POA: Diagnosis not present

## 2024-03-20 DIAGNOSIS — Z8042 Family history of malignant neoplasm of prostate: Secondary | ICD-10-CM | POA: Insufficient documentation

## 2024-03-20 DIAGNOSIS — C8515 Unspecified B-cell lymphoma, lymph nodes of inguinal region and lower limb: Secondary | ICD-10-CM

## 2024-03-20 LAB — CBC WITH DIFFERENTIAL (CANCER CENTER ONLY)
Abs Immature Granulocytes: 0.01 10*3/uL (ref 0.00–0.07)
Basophils Absolute: 0 10*3/uL (ref 0.0–0.1)
Basophils Relative: 1 %
Eosinophils Absolute: 0.2 10*3/uL (ref 0.0–0.5)
Eosinophils Relative: 5 %
HCT: 40.1 % (ref 39.0–52.0)
Hemoglobin: 13.8 g/dL (ref 13.0–17.0)
Immature Granulocytes: 0 %
Lymphocytes Relative: 15 %
Lymphs Abs: 0.5 10*3/uL — ABNORMAL LOW (ref 0.7–4.0)
MCH: 33.3 pg (ref 26.0–34.0)
MCHC: 34.4 g/dL (ref 30.0–36.0)
MCV: 96.6 fL (ref 80.0–100.0)
Monocytes Absolute: 0.6 10*3/uL (ref 0.1–1.0)
Monocytes Relative: 17 %
Neutro Abs: 2.2 10*3/uL (ref 1.7–7.7)
Neutrophils Relative %: 62 %
Platelet Count: 264 10*3/uL (ref 150–400)
RBC: 4.15 MIL/uL — ABNORMAL LOW (ref 4.22–5.81)
RDW: 13.2 % (ref 11.5–15.5)
WBC Count: 3.5 10*3/uL — ABNORMAL LOW (ref 4.0–10.5)
nRBC: 0 % (ref 0.0–0.2)

## 2024-03-20 LAB — CMP (CANCER CENTER ONLY)
ALT: 18 U/L (ref 0–44)
AST: 21 U/L (ref 15–41)
Albumin: 4.3 g/dL (ref 3.5–5.0)
Alkaline Phosphatase: 69 U/L (ref 38–126)
Anion gap: 8 (ref 5–15)
BUN: 18 mg/dL (ref 8–23)
CO2: 28 mmol/L (ref 22–32)
Calcium: 9.3 mg/dL (ref 8.9–10.3)
Chloride: 103 mmol/L (ref 98–111)
Creatinine: 0.92 mg/dL (ref 0.61–1.24)
GFR, Estimated: >60 mL/min (ref >60)
Glucose, Bld: 116 mg/dL — ABNORMAL HIGH (ref 70–99)
Potassium: 4.5 mmol/L (ref 3.5–5.1)
Sodium: 139 mmol/L (ref 135–145)
Total Bilirubin: 0.9 mg/dL (ref 0.0–1.2)
Total Protein: 7 g/dL (ref 6.5–8.1)

## 2024-03-20 LAB — LACTATE DEHYDROGENASE: LDH: 160 U/L (ref 98–192)

## 2024-03-21 LAB — KAPPA/LAMBDA LIGHT CHAINS
Kappa free light chain: 65 mg/L — ABNORMAL HIGH (ref 3.3–19.4)
Kappa, lambda light chain ratio: 15.48 — ABNORMAL HIGH (ref 0.26–1.65)
Lambda free light chains: 4.2 mg/L — ABNORMAL LOW (ref 5.7–26.3)

## 2024-03-21 LAB — IGG, IGA, IGM
IgA: 23 mg/dL — ABNORMAL LOW (ref 61–437)
IgG (Immunoglobin G), Serum: 1570 mg/dL (ref 603–1613)
IgM (Immunoglobulin M), Srm: 38 mg/dL (ref 20–172)

## 2024-03-24 LAB — PROTEIN ELECTROPHORESIS, SERUM
A/G Ratio: 1.5 (ref 0.7–1.7)
Albumin ELP: 4 g/dL (ref 2.9–4.4)
Alpha-1-Globulin: 0.2 g/dL (ref 0.0–0.4)
Alpha-2-Globulin: 0.6 g/dL (ref 0.4–1.0)
Beta Globulin: 0.8 g/dL (ref 0.7–1.3)
Gamma Globulin: 1.1 g/dL (ref 0.4–1.8)
Globulin, Total: 2.7 g/dL (ref 2.2–3.9)
M-Spike, %: 0.7 g/dL — ABNORMAL HIGH
Total Protein ELP: 6.7 g/dL (ref 6.0–8.5)

## 2024-03-27 ENCOUNTER — Telehealth: Payer: Self-pay | Admitting: Oncology

## 2024-03-27 ENCOUNTER — Inpatient Hospital Stay: Payer: BC Managed Care – PPO | Admitting: Oncology

## 2024-03-27 ENCOUNTER — Encounter: Payer: Self-pay | Admitting: Oncology

## 2024-03-27 VITALS — BP 134/66 | HR 67 | Temp 98.1°F | Resp 18 | Ht 72.0 in | Wt 173.0 lb

## 2024-03-27 DIAGNOSIS — Z8042 Family history of malignant neoplasm of prostate: Secondary | ICD-10-CM | POA: Diagnosis not present

## 2024-03-27 DIAGNOSIS — C8510 Unspecified B-cell lymphoma, unspecified site: Secondary | ICD-10-CM | POA: Diagnosis not present

## 2024-03-27 DIAGNOSIS — C8515 Unspecified B-cell lymphoma, lymph nodes of inguinal region and lower limb: Secondary | ICD-10-CM | POA: Diagnosis not present

## 2024-03-27 DIAGNOSIS — R530 Neoplastic (malignant) related fatigue: Secondary | ICD-10-CM | POA: Diagnosis not present

## 2024-03-27 DIAGNOSIS — D75839 Thrombocytosis, unspecified: Secondary | ICD-10-CM | POA: Diagnosis not present

## 2024-03-27 NOTE — Telephone Encounter (Signed)
 Patient has been scheduled for follow-up visit per 03/27/24 LOS.  LVM notifying pt of appt details, provided my direct number to pt if appt changes need to be made.

## 2024-03-27 NOTE — Progress Notes (Signed)
 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  He feels well.  Good appetite and energy level.  No fever or night sweats.  The groin lymph nodes are slightly larger.  He would like to have the lipoma removed from the left neck.  Objective:  Vital signs in last 24 hours:  Blood pressure 134/66, pulse 67, temperature 98.1 F (36.7 C), temperature source Temporal, resp. rate 18, height 6' (1.829 m), weight 173 lb (78.5 kg), SpO2 100%.    HEENT: Soft mobile cutaneous mass at the left lower neck Lymphatics: No cervical or supraclavicular nodes.  1-1.5 bilateral axillary nodes, 1-3 cm bilateral inguinal and femoral nodes Resp: Lungs clear bilaterally Cardio: Regular rate and rhythm GI: No hepatosplenomegaly Vascular: No leg edema  Lab Results:  Lab Results  Component Value Date   WBC 3.5 (L) 03/20/2024   HGB 13.8 03/20/2024   HCT 40.1 03/20/2024   MCV 96.6 03/20/2024   PLT 264 03/20/2024   NEUTROABS 2.2 03/20/2024    CMP  Lab Results  Component Value Date   NA 139 03/20/2024   K 4.5 03/20/2024   CL 103 03/20/2024   CO2 28 03/20/2024   GLUCOSE 116 (H) 03/20/2024   BUN 18 03/20/2024   CREATININE 0.92 03/20/2024   CALCIUM 9.3 03/20/2024   PROT 7.0 03/20/2024   ALBUMIN 4.3 03/20/2024   AST 21 03/20/2024   ALT 18 03/20/2024   ALKPHOS 69 03/20/2024   BILITOT 0.9 03/20/2024   GFRNONAA >60 03/20/2024   GFRAA 101 09/25/2019     Medications: I have reviewed the patient's current medications.   Assessment/Plan: Low-grade B-cell non-Hodgkin's lymphoma Palpable lymphadenopathy IgG kappa serum M spike with elevated free kappa light chain Bone marrow biopsy 12/05/2021-hypercellular marrow, 95%, diffusely infiltrated by B-cell lymphoma, no expression of CD5 or CD10, CD20 positive.  Differential diagnosis includes marginal zone lymphoma and lymphoplasmacytic lymphoma, flow cytometry  identified a kappa restricted B-cell population compromising 80% of lymphocytes, 46 XY karyotype,MYD88 positive CTs 12/08/2021-lipoma to left lower neck/upper chest, hepatomegaly with numerous liver cysts, prominent right inguinal node, no splenomegaly, no suspicious bone lesions, 9 mm focus of hyperenhancement in the left hepatic lobe, small right adrenal nodule Cycle 1 Bendamustine /rituximab  12/18/2021 Cycle 2 Bendamustine /rituximab  01/15/2022 Cycle 3 Bendamustine /rituximab  02/12/2022, Bendamustine  dose escalated to 90 mg per metered squared Cycle 4 Bendamustine /rituximab  03/19/2022-rituximab  given over 2 days secondary to infusion rate related allergic reaction Bone marrow biopsy 04/21/2022-persistent involvement by low-grade B-cell lymphoma, improved, flow cytometry confirmed a monoclonal kappa restricted B-cell population, CD138 highlights a minor plasma cell component within the lymphoid infiltrates, the plasma cells have kappa light chain restriction PET at [facility] 08/28/2022-right hilar and bilateral axillary nodes with minimal uptake, no suspicious hypermetabolic lymph nodes in the abdomen or pelvis, mild diffuse marrow uptake comparison to the PET from 12/22/2021-bilateral axillary, subpectoral, right hilar, and abdominal lymph nodes have decreased uptake consistent with treatment response, Deauville 2, decreased bone marrow uptake  2.   Macrocytic anemia secondary to #1 3.   Mild thrombocytosis 4.   History of prostate cancer 5.   Malaise secondary to #1 6.   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  He is asymptomatic from lymphoma.  The palpable lymphadenopathy has not  changed significantly since he was here in January.   The plan is to continue observation.  He will return for an office and lab visit in approximately 5 months.  He will call for new symptoms.  We will refer him to consider removal of the left neck lipoma.  Coni Deep, MD  03/27/2024  8:28 AM

## 2024-03-31 ENCOUNTER — Telehealth: Payer: Self-pay | Admitting: *Deleted

## 2024-03-31 DIAGNOSIS — C8515 Unspecified B-cell lymphoma, lymph nodes of inguinal region and lower limb: Secondary | ICD-10-CM

## 2024-03-31 NOTE — Telephone Encounter (Signed)
 Per Dr. Scherrie Curt discussion with Dr. Sofia Dunn. Surgeon thinks he needs a neurosurgeon to remove the lipoma do to location. He recommends Dr. Agustina Aldrich. He is out office till 1st week of June and Dr. Scherrie Curt is out that week. Can make referral now and Dr. Scherrie Curt will discuss case w/NS when he returns to office on 6/9. [facility] notified and he would like referral order placed now.

## 2024-04-12 ENCOUNTER — Telehealth: Payer: Self-pay | Admitting: *Deleted

## 2024-04-12 NOTE — Telephone Encounter (Signed)
 LVM for [facility] to inquire if he has heard from Dr. Adonis Alamin yet?

## 2024-04-28 ENCOUNTER — Encounter: Payer: Self-pay | Admitting: *Deleted

## 2024-05-09 ENCOUNTER — Encounter: Payer: Self-pay | Admitting: *Deleted

## 2024-05-09 NOTE — Progress Notes (Signed)
 Faxed referral order, demographics, medical records to Dr. Victory Gunnels at [phone]. Mr. [facility] reports he already has an appointment.

## 2024-05-17 DIAGNOSIS — D170 Benign lipomatous neoplasm of skin and subcutaneous tissue of head, face and neck: Secondary | ICD-10-CM | POA: Diagnosis not present

## 2024-05-24 ENCOUNTER — Encounter (HOSPITAL_BASED_OUTPATIENT_CLINIC_OR_DEPARTMENT_OTHER): Payer: Self-pay | Admitting: Neurosurgery

## 2024-05-26 ENCOUNTER — Other Ambulatory Visit (HOSPITAL_BASED_OUTPATIENT_CLINIC_OR_DEPARTMENT_OTHER): Payer: Self-pay | Admitting: Neurosurgery

## 2024-05-26 DIAGNOSIS — D170 Benign lipomatous neoplasm of skin and subcutaneous tissue of head, face and neck: Secondary | ICD-10-CM

## 2024-06-26 ENCOUNTER — Ambulatory Visit (HOSPITAL_BASED_OUTPATIENT_CLINIC_OR_DEPARTMENT_OTHER)

## 2024-07-27 DIAGNOSIS — C44519 Basal cell carcinoma of skin of other part of trunk: Secondary | ICD-10-CM | POA: Diagnosis not present

## 2024-07-27 DIAGNOSIS — C4441 Basal cell carcinoma of skin of scalp and neck: Secondary | ICD-10-CM | POA: Diagnosis not present

## 2024-07-27 DIAGNOSIS — L570 Actinic keratosis: Secondary | ICD-10-CM | POA: Diagnosis not present

## 2024-08-21 ENCOUNTER — Inpatient Hospital Stay: Attending: Oncology

## 2024-08-21 DIAGNOSIS — D539 Nutritional anemia, unspecified: Secondary | ICD-10-CM | POA: Insufficient documentation

## 2024-08-21 DIAGNOSIS — D75839 Thrombocytosis, unspecified: Secondary | ICD-10-CM | POA: Diagnosis not present

## 2024-08-21 DIAGNOSIS — Z8546 Personal history of malignant neoplasm of prostate: Secondary | ICD-10-CM | POA: Diagnosis not present

## 2024-08-21 DIAGNOSIS — C8510 Unspecified B-cell lymphoma, unspecified site: Secondary | ICD-10-CM | POA: Diagnosis not present

## 2024-08-21 DIAGNOSIS — R530 Neoplastic (malignant) related fatigue: Secondary | ICD-10-CM | POA: Insufficient documentation

## 2024-08-21 DIAGNOSIS — C8515 Unspecified B-cell lymphoma, lymph nodes of inguinal region and lower limb: Secondary | ICD-10-CM

## 2024-08-21 LAB — CBC WITH DIFFERENTIAL (CANCER CENTER ONLY)
Abs Immature Granulocytes: 0.01 K/uL (ref 0.00–0.07)
Basophils Absolute: 0 K/uL (ref 0.0–0.1)
Basophils Relative: 1 %
Eosinophils Absolute: 0.1 K/uL (ref 0.0–0.5)
Eosinophils Relative: 2 %
HCT: 39.4 % (ref 39.0–52.0)
Hemoglobin: 13.6 g/dL (ref 13.0–17.0)
Immature Granulocytes: 0 %
Lymphocytes Relative: 14 %
Lymphs Abs: 0.6 K/uL — ABNORMAL LOW (ref 0.7–4.0)
MCH: 33 pg (ref 26.0–34.0)
MCHC: 34.5 g/dL (ref 30.0–36.0)
MCV: 95.6 fL (ref 80.0–100.0)
Monocytes Absolute: 0.6 K/uL (ref 0.1–1.0)
Monocytes Relative: 16 %
Neutro Abs: 2.6 K/uL (ref 1.7–7.7)
Neutrophils Relative %: 67 %
Platelet Count: 299 K/uL (ref 150–400)
RBC: 4.12 MIL/uL — ABNORMAL LOW (ref 4.22–5.81)
RDW: 13.3 % (ref 11.5–15.5)
WBC Count: 4 K/uL (ref 4.0–10.5)
nRBC: 0 % (ref 0.0–0.2)

## 2024-08-21 LAB — LACTATE DEHYDROGENASE: LDH: 196 U/L — ABNORMAL HIGH (ref 98–192)

## 2024-08-21 LAB — CMP (CANCER CENTER ONLY)
ALT: 12 U/L (ref 0–44)
AST: 19 U/L (ref 15–41)
Albumin: 4.4 g/dL (ref 3.5–5.0)
Alkaline Phosphatase: 81 U/L (ref 38–126)
Anion gap: 8 (ref 5–15)
BUN: 14 mg/dL (ref 8–23)
CO2: 29 mmol/L (ref 22–32)
Calcium: 9.3 mg/dL (ref 8.9–10.3)
Chloride: 102 mmol/L (ref 98–111)
Creatinine: 0.87 mg/dL (ref 0.61–1.24)
GFR, Estimated: >60 mL/min (ref >60)
Glucose, Bld: 93 mg/dL (ref 70–99)
Potassium: 4.5 mmol/L (ref 3.5–5.1)
Sodium: 139 mmol/L (ref 135–145)
Total Bilirubin: 1.1 mg/dL (ref 0.0–1.2)
Total Protein: 7.5 g/dL (ref 6.5–8.1)

## 2024-08-22 LAB — KAPPA/LAMBDA LIGHT CHAINS
Kappa free light chain: 77.7 mg/L — ABNORMAL HIGH (ref 3.3–19.4)
Kappa, lambda light chain ratio: 10.64 — ABNORMAL HIGH (ref 0.26–1.65)
Lambda free light chains: 7.3 mg/L (ref 5.7–26.3)

## 2024-08-22 LAB — IGG: IgG (Immunoglobin G), Serum: 1915 mg/dL — ABNORMAL HIGH (ref 603–1613)

## 2024-08-23 LAB — PROTEIN ELECTROPHORESIS, SERUM
A/G Ratio: 1.2 (ref 0.7–1.7)
Albumin ELP: 3.8 g/dL (ref 2.9–4.4)
Alpha-1-Globulin: 0.2 g/dL (ref 0.0–0.4)
Alpha-2-Globulin: 0.6 g/dL (ref 0.4–1.0)
Beta Globulin: 0.8 g/dL (ref 0.7–1.3)
Gamma Globulin: 1.5 g/dL (ref 0.4–1.8)
Globulin, Total: 3.1 g/dL (ref 2.2–3.9)
M-Spike, %: 1.2 g/dL — ABNORMAL HIGH
Total Protein ELP: 6.9 g/dL (ref 6.0–8.5)

## 2024-08-28 ENCOUNTER — Other Ambulatory Visit: Payer: Self-pay

## 2024-08-28 ENCOUNTER — Inpatient Hospital Stay: Admitting: Oncology

## 2024-08-28 ENCOUNTER — Inpatient Hospital Stay

## 2024-08-28 VITALS — BP 127/66 | HR 67 | Temp 97.8°F | Resp 18 | Ht 72.0 in | Wt 173.5 lb

## 2024-08-28 DIAGNOSIS — Z8546 Personal history of malignant neoplasm of prostate: Secondary | ICD-10-CM | POA: Diagnosis not present

## 2024-08-28 DIAGNOSIS — C8515 Unspecified B-cell lymphoma, lymph nodes of inguinal region and lower limb: Secondary | ICD-10-CM

## 2024-08-28 DIAGNOSIS — D75839 Thrombocytosis, unspecified: Secondary | ICD-10-CM | POA: Diagnosis not present

## 2024-08-28 DIAGNOSIS — D539 Nutritional anemia, unspecified: Secondary | ICD-10-CM | POA: Diagnosis not present

## 2024-08-28 DIAGNOSIS — C8510 Unspecified B-cell lymphoma, unspecified site: Secondary | ICD-10-CM | POA: Diagnosis not present

## 2024-08-28 NOTE — Progress Notes (Signed)
**[facility]**  [facility]   Diagnosis: Non-Hodgkin's lymphoma  INTERVAL HISTORY:   [facility] returns as scheduled.  He generally feels well.  Good appetite and energy level.  He is exercising.  No fever.  He reports approximately 3 night sweats over the past 6 months.  The groin lymph nodes are larger.  He is scheduled for an MRI prior to section of the left neck lipoma.  Objective:  Vital signs in last 24 hours:  Blood pressure 127/66, pulse 67, temperature 97.8 F (36.6 C), temperature source Temporal, resp. rate 18, height 6' (1.829 m), weight 173 lb 8 oz (78.7 kg), SpO2 98%. HEENT: Soft masslike fullness at the left lower neck/supraclavicular fossa Lymphatics: No cervical or supraclavicular nodes, 1-1.5 cm bilateral axillary nodes, 1-3 cm bilateral inguinal and femoral nodes Resp: Lungs clear bilaterally Cardio: Regular rate and rhythm GI: No hepatosplenomegaly Vascular: No leg edema   Lab Results:  Lab Results  Component Value Date   WBC 4.0 08/21/2024   HGB 13.6 08/21/2024   HCT 39.4 08/21/2024   MCV 95.6 08/21/2024   PLT 299 08/21/2024   NEUTROABS 2.6 08/21/2024    CMP  Lab Results  Component Value Date   NA 139 08/21/2024   K 4.5 08/21/2024   CL 102 08/21/2024   CO2 29 08/21/2024   GLUCOSE 93 08/21/2024   BUN 14 08/21/2024   CREATININE 0.87 08/21/2024   CALCIUM 9.3 08/21/2024   PROT 7.5 08/21/2024   ALBUMIN 4.4 08/21/2024   AST 19 08/21/2024   ALT 12 08/21/2024   ALKPHOS 81 08/21/2024   BILITOT 1.1 08/21/2024   GFRNONAA >60 08/21/2024   GFRAA 101 09/25/2019    No results found for: CEA1, CEA, CAN199, CA125  No results found for: INR, LABPROT  Imaging:  No results found.  Medications: I have reviewed the patient's current medications.   Assessment/Plan:  Low-grade B-cell non-Hodgkin's lymphoma Palpable lymphadenopathy IgG kappa serum M spike with elevated free kappa light chain Bone marrow biopsy  12/05/2021-hypercellular marrow, 95%, diffusely infiltrated by B-cell lymphoma, no expression of CD5 or CD10, CD20 positive.  Differential diagnosis includes marginal zone lymphoma and lymphoplasmacytic lymphoma, flow cytometry identified a kappa restricted B-cell population compromising 80% of lymphocytes, 46 XY karyotype,MYD88 positive CTs 12/08/2021-lipoma to left lower neck/upper chest, hepatomegaly with numerous liver cysts, prominent right inguinal node, no splenomegaly, no suspicious bone lesions, 9 mm focus of hyperenhancement in the left hepatic lobe, small right adrenal nodule Cycle 1 Bendamustine /rituximab  12/18/2021 Cycle 2 Bendamustine /rituximab  01/15/2022 Cycle 3 Bendamustine /rituximab  02/12/2022, Bendamustine  dose escalated to 90 mg per metered squared Cycle 4 Bendamustine /rituximab  03/19/2022-rituximab  given over 2 days secondary to infusion rate related allergic reaction Bone marrow biopsy 04/21/2022-persistent involvement by low-grade B-cell lymphoma, improved, flow cytometry confirmed a monoclonal kappa restricted B-cell population, CD138 highlights a minor plasma cell component within the lymphoid infiltrates, the plasma cells have kappa light chain restriction PET at [facility] 08/28/2022-right hilar and bilateral axillary nodes with minimal uptake, no suspicious hypermetabolic lymph nodes in the abdomen or pelvis, mild diffuse marrow uptake comparison to the PET from 12/22/2021-bilateral axillary, subpectoral, right hilar, and abdominal lymph nodes have decreased uptake consistent with treatment response, [place]ville 2, decreased bone marrow uptake  2.   Macrocytic anemia secondary to #1 3.   Mild thrombocytosis 4.   History of prostate cancer 5.   Malaise secondary to #1 6.   Pruritus/rash during rituximab  infusion 12/18/2021-improved with additional Benadryl , Pepcid , completed rituximab  infusion, recurrent allergy symptoms during subsequent  rituximab  infusions-infusions completed at a slower rate with  additional steroids and antihistamine therapy     Disposition: Ethan Ethan has a low-grade B-cell lymphoma.  He appears asymptomatic.  The palpable lymph nodes in the groin are stable versus slightly larger compared to when I saw him earlier this year.  The serum M spike is slightly higher.  He is stable from a hematologic standpoint.  I recommend continuing observation.  He is comfortable with observation.  He will call for new symptoms. Ethan Ethan received an influenza vaccine today.  He will return for an office and lab visit in 6 months.  Ethan Hof, MD  08/28/2024  8:44 AM

## 2024-09-18 ENCOUNTER — Encounter (HOSPITAL_BASED_OUTPATIENT_CLINIC_OR_DEPARTMENT_OTHER): Payer: Self-pay

## 2024-09-18 ENCOUNTER — Ambulatory Visit (HOSPITAL_BASED_OUTPATIENT_CLINIC_OR_DEPARTMENT_OTHER)

## 2024-11-23 ENCOUNTER — Other Ambulatory Visit (HOSPITAL_BASED_OUTPATIENT_CLINIC_OR_DEPARTMENT_OTHER): Payer: Self-pay | Admitting: Neurosurgery

## 2024-11-23 DIAGNOSIS — D170 Benign lipomatous neoplasm of skin and subcutaneous tissue of head, face and neck: Secondary | ICD-10-CM

## 2024-12-07 ENCOUNTER — Ambulatory Visit (HOSPITAL_BASED_OUTPATIENT_CLINIC_OR_DEPARTMENT_OTHER)
Admission: RE | Admit: 2024-12-07 | Discharge: 2024-12-07 | Disposition: A | Discharge status: Home / Self Care | Payer: Self-pay | Source: Ambulatory Visit | Attending: Neurosurgery | Admitting: Neurosurgery

## 2024-12-07 DIAGNOSIS — D170 Benign lipomatous neoplasm of skin and subcutaneous tissue of head, face and neck: Secondary | ICD-10-CM | POA: Insufficient documentation

## 2024-12-07 MED ORDER — IOHEXOL 300 MG/ML  SOLN
75.0000 mL | Freq: Once | INTRAMUSCULAR | Status: AC | PRN
  Administered 2024-12-07: 100 mL via INTRAVENOUS

## 2025-02-12 ENCOUNTER — Inpatient Hospital Stay

## 2025-02-19 ENCOUNTER — Inpatient Hospital Stay: Admitting: Oncology
